# Patient Record
Sex: Female | Born: 1970 | Hispanic: No | Smoking: Never smoker
Health system: Southern US, Community
[De-identification: ages and names within clinical notes are randomized; demographics above are authoritative.]

## PROBLEM LIST (undated history)

## (undated) DIAGNOSIS — K219 Gastro-esophageal reflux disease without esophagitis: Secondary | ICD-10-CM

## (undated) DIAGNOSIS — R2231 Localized swelling, mass and lump, right upper limb: Secondary | ICD-10-CM

## (undated) DIAGNOSIS — I10 Essential (primary) hypertension: Secondary | ICD-10-CM

## (undated) HISTORY — PX: TUBAL LIGATION: SHX77

## (undated) HISTORY — PX: APPENDECTOMY: SHX54

---

## 2000-01-30 ENCOUNTER — Inpatient Hospital Stay (HOSPITAL_COMMUNITY): Admission: EM | Admit: 2000-01-30 | Discharge: 2000-02-01 | Payer: Self-pay | Admitting: Emergency Medicine

## 2000-05-30 ENCOUNTER — Encounter: Payer: Self-pay | Admitting: Emergency Medicine

## 2000-05-30 ENCOUNTER — Emergency Department (HOSPITAL_COMMUNITY): Admission: EM | Admit: 2000-05-30 | Discharge: 2000-05-30 | Payer: Self-pay | Admitting: Emergency Medicine

## 2011-03-30 HISTORY — PX: BREAST BIOPSY: SHX20

## 2012-03-07 ENCOUNTER — Encounter (HOSPITAL_COMMUNITY): Payer: Self-pay

## 2012-03-07 ENCOUNTER — Ambulatory Visit (HOSPITAL_COMMUNITY)
Admission: RE | Admit: 2012-03-07 | Discharge: 2012-03-07 | Disposition: A | Discharge status: Home / Self Care | Payer: Self-pay | Source: Ambulatory Visit | Attending: Obstetrics and Gynecology | Admitting: Obstetrics and Gynecology

## 2012-03-07 VITALS — BP 132/86 | Temp 98.3°F | Ht 63.0 in | Wt 178.4 lb

## 2012-03-07 DIAGNOSIS — N63 Unspecified lump in unspecified breast: Secondary | ICD-10-CM | POA: Insufficient documentation

## 2012-03-07 DIAGNOSIS — Z1239 Encounter for other screening for malignant neoplasm of breast: Secondary | ICD-10-CM

## 2012-03-07 HISTORY — DX: Essential (primary) hypertension: I10

## 2012-03-07 NOTE — Progress Notes (Addendum)
Patient referred to [facility] from [facility] Due to recommending left breast biopsy. Diagnostic mammogram completed 03/02/2012.  Pap Smear:    Pap smear not performed today. Per patient last Pap smear was November 2013 at ? [facility] on [address] and normal. Per patient she has no history of abnormal Pap smears. No Pap smear results in EPIC.  Physical exam: Breasts Breasts symmetrical. No skin abnormalities bilateral breasts. No nipple retraction bilateral breasts. No nipple discharge bilateral breasts. No lymphadenopathy. Palpated lump in the right breast at 8 o'clock 2 cm from the nipple. Palpated 4 small lumps in left breast at 1 o'clock 4 cm from the nipple, 2 o'clock 3 cm from the nipple, 4 o'clock 4 cm from the nipple and 8 o'clock 2 cm from the nipple. The lump at 1 o'clock is the lump that a biopsy is recommended. No complaints of pain or tenderness on exam. Patient referred to [facility] per recommendation of biopsy left breast. Appointment scheduled for Thursday, March 09, 2012 at 1300.         Pelvic/Bimanual No Pap smear completed today since last Pap smear was November 2013 and normal per patient. Pap smear not indicated per BCCCP guidelines.

## 2012-03-07 NOTE — Patient Instructions (Addendum)
Taught patient how to perform BSE and gave educational materials to take home. Patient did not need a Pap smear today due to last Pap smear was November 2013 per patient. Let her know BCCCP will cover Pap smears every 3 years unless has a history of abnormal Pap smears. Patient referred to [facility] per recommendation of biopsy left breast. Appointment scheduled for Thursday, March 09, 2012 at 1300. Patient aware of appointment and will be there. Patient verbalized understanding.

## 2012-08-18 ENCOUNTER — Telehealth (HOSPITAL_COMMUNITY): Payer: Self-pay | Admitting: *Deleted

## 2012-08-18 NOTE — Telephone Encounter (Signed)
Telephoned patient at home # with interpreter Delorise Royals. Advised patient was time to follow up with [facility] for mammogram. Patient voiced understanding. Patient would like for [place] to make appointment. Telephoned Jessica at [place] and left message to return call to [facility]

## 2013-02-02 ENCOUNTER — Encounter (HOSPITAL_COMMUNITY): Payer: Self-pay

## 2013-04-12 ENCOUNTER — Telehealth (HOSPITAL_COMMUNITY): Payer: Self-pay | Admitting: *Deleted

## 2013-04-12 NOTE — Telephone Encounter (Signed)
Telephoned patient to renew BCCCP. Patient now has insurance and will call schedule appt with [facility] for follow up.

## 2014-01-28 ENCOUNTER — Encounter (HOSPITAL_COMMUNITY): Payer: Self-pay

## 2015-02-15 ENCOUNTER — Emergency Department (HOSPITAL_COMMUNITY): Payer: Self-pay

## 2015-02-15 ENCOUNTER — Encounter (HOSPITAL_COMMUNITY): Payer: Self-pay | Admitting: Emergency Medicine

## 2015-02-15 ENCOUNTER — Emergency Department (HOSPITAL_COMMUNITY)
Admission: EM | Admit: 2015-02-15 | Discharge: 2015-02-15 | Disposition: A | Discharge status: Home / Self Care | Payer: Self-pay | Attending: Emergency Medicine | Admitting: Emergency Medicine

## 2015-02-15 DIAGNOSIS — I10 Essential (primary) hypertension: Secondary | ICD-10-CM | POA: Insufficient documentation

## 2015-02-15 DIAGNOSIS — R1310 Dysphagia, unspecified: Secondary | ICD-10-CM | POA: Insufficient documentation

## 2015-02-15 DIAGNOSIS — M542 Cervicalgia: Secondary | ICD-10-CM | POA: Insufficient documentation

## 2015-02-15 DIAGNOSIS — R0989 Other specified symptoms and signs involving the circulatory and respiratory systems: Secondary | ICD-10-CM | POA: Insufficient documentation

## 2015-02-15 LAB — BASIC METABOLIC PANEL
Anion gap: 6 (ref 5–15)
BUN: 8 mg/dL (ref 6–20)
CO2: 28 mmol/L (ref 22–32)
Calcium: 8.7 mg/dL — ABNORMAL LOW (ref 8.9–10.3)
Chloride: 104 mmol/L (ref 101–111)
Creatinine, Ser: 0.56 mg/dL (ref 0.44–1.00)
GFR calc Af Amer: >60 mL/min (ref >60)
GFR calc non Af Amer: >60 mL/min (ref >60)
Glucose, Bld: 123 mg/dL — ABNORMAL HIGH (ref 65–99)
Potassium: 3.9 mmol/L (ref 3.5–5.1)
Sodium: 138 mmol/L (ref 135–145)

## 2015-02-15 LAB — CBC WITH DIFFERENTIAL/PLATELET
Basophils Absolute: 0 10*3/uL (ref 0.0–0.1)
Basophils Relative: 1 %
Eosinophils Absolute: 0.2 10*3/uL (ref 0.0–0.7)
Eosinophils Relative: 3 %
HCT: 37.4 % (ref 36.0–46.0)
Hemoglobin: 11.9 g/dL — ABNORMAL LOW (ref 12.0–15.0)
Lymphocytes Relative: 27 %
Lymphs Abs: 2.1 10*3/uL (ref 0.7–4.0)
MCH: 28.2 pg (ref 26.0–34.0)
MCHC: 31.8 g/dL (ref 30.0–36.0)
MCV: 88.6 fL (ref 78.0–100.0)
Monocytes Absolute: 0.5 10*3/uL (ref 0.1–1.0)
Monocytes Relative: 6 %
Neutro Abs: 4.8 10*3/uL (ref 1.7–7.7)
Neutrophils Relative %: 63 %
Platelets: 372 10*3/uL (ref 150–400)
RBC: 4.22 MIL/uL (ref 3.87–5.11)
RDW: 13.5 % (ref 11.5–15.5)
WBC: 7.6 10*3/uL (ref 4.0–10.5)

## 2015-02-15 NOTE — ED Provider Notes (Signed)
CSN: [id number]     Arrival date & time 02/15/15  [id number] History   First MD Initiated Contact with Patient 02/15/15 [phone]     Chief Complaint  Patient presents with  . Foreign Body     (Consider location/radiation/quality/duration/timing/severity/associated sxs/prior Treatment) Patient is a 44 y.o. female presenting with foreign body. The history is provided by the patient and a relative.  Foreign Body Associated symptoms: trouble swallowing   Associated symptoms: no abdominal pain, no choking, no nausea, no voice change and no vomiting    patient while eating fish get sensation of a fish bone being stuck in the lower part her for throat neck area. This occurred last evening at around 7 PM. Patient's been able to eat and swallow liquids since then but this morning the sensation was more increased she's afraid that a fish bone is stuck there. No change in voice. Patient without any breathing problems.  Past Medical History  Diagnosis Date  . Hypertension    History reviewed. No pertinent past surgical history. No family history on file. Social History  Substance Use Topics  . Smoking status: Never Smoker   . Smokeless tobacco: Never Used  . Alcohol Use: No   OB History    Gravida Para Term Preterm AB TAB SAB Ectopic Multiple Living   3 3 3       3      Review of Systems  Constitutional: Negative for fever.  HENT: Positive for trouble swallowing. Negative for voice change.   Eyes: Negative for redness.  Respiratory: Negative for choking and shortness of breath.   Cardiovascular: Negative for chest pain.  Gastrointestinal: Negative for nausea, vomiting and abdominal pain.  Musculoskeletal: Positive for neck pain. Negative for neck stiffness.  Skin: Negative for rash.  Neurological: Negative for speech difficulty.  Hematological: Does not bruise/bleed easily.  Psychiatric/Behavioral: Negative for confusion.      Allergies  Review of patient's allergies indicates no known  allergies.  Home Medications   Prior to Admission medications   Not on File   BP 141/76 mmHg  Pulse 72  Temp(Src) 98 F (36.7 C) (Oral)  Resp 17  Ht 5\' 3"  (1.6 m)  Wt 197 lb 7 oz (89.557 kg)  BMI 34.98 kg/m2  SpO2 97%  LMP 01/25/2015 Physical Exam  Constitutional: She is oriented to person, place, and time. She appears well-developed and well-nourished. No distress.  HENT:  Head: Normocephalic and atraumatic.  Mouth/Throat: Oropharynx is clear and moist. No oropharyngeal exudate.  Eyes: Conjunctivae and EOM are normal. Pupils are equal, round, and reactive to light.  Neck: Normal range of motion. Neck supple.  Cardiovascular: Normal rate and regular rhythm.   No murmur heard. Pulmonary/Chest: Effort normal and breath sounds normal.  Abdominal: Soft. Bowel sounds are normal. There is no tenderness.  Musculoskeletal: Normal range of motion.  Neurological: She is alert and oriented to person, place, and time. No cranial nerve deficit. She exhibits normal muscle tone. Coordination normal.  Skin: Skin is warm. No rash noted.  Nursing note and vitals reviewed.   ED Course  Procedures (including critical care time) Labs Review Labs Reviewed  BASIC METABOLIC PANEL - Abnormal; Notable for the following:    Glucose, Bld 123 (*)    Calcium 8.7 (*)    All other components within normal limits  CBC WITH DIFFERENTIAL/PLATELET - Abnormal; Notable for the following:    Hemoglobin 11.9 (*)    All other components within normal limits   Results for  orders placed or performed during the hospital encounter of 0000000  Basic metabolic panel  Result Value Ref Range   Sodium 138 135 - 145 mmol/L   Potassium 3.9 3.5 - 5.1 mmol/L   Chloride 104 101 - 111 mmol/L   CO2 28 22 - 32 mmol/L   Glucose, Bld 123 (H) 65 - 99 mg/dL   BUN 8 6 - 20 mg/dL   Creatinine, Ser 0.56 0.44 - 1.00 mg/dL   Calcium 8.7 (L) 8.9 - 10.3 mg/dL   GFR calc non Af Amer >60 >60 mL/min   GFR calc Af Amer >60 >60  mL/min   Anion gap 6 5 - 15  CBC with Differential/Platelet  Result Value Ref Range   WBC 7.6 4.0 - 10.5 K/uL   RBC 4.22 3.87 - 5.11 MIL/uL   Hemoglobin 11.9 (L) 12.0 - 15.0 g/dL   HCT 37.4 36.0 - 46.0 %   MCV 88.6 78.0 - 100.0 fL   MCH 28.2 26.0 - 34.0 pg   MCHC 31.8 30.0 - 36.0 g/dL   RDW 13.5 11.5 - 15.5 %   Platelets 372 150 - 400 K/uL   Neutrophils Relative % 63 %   Neutro Abs 4.8 1.7 - 7.7 K/uL   Lymphocytes Relative 27 %   Lymphs Abs 2.1 0.7 - 4.0 K/uL   Monocytes Relative 6 %   Monocytes Absolute 0.5 0.1 - 1.0 K/uL   Eosinophils Relative 3 %   Eosinophils Absolute 0.2 0.0 - 0.7 K/uL   Basophils Relative 1 %   Basophils Absolute 0.0 0.0 - 0.1 K/uL     Imaging Review Ct Soft Tissue Neck Wo Contrast  02/15/2015  CLINICAL DATA:  The patient feels like she has a fish bone stuck in her throat from dinner last night. EXAM: CT NECK WITHOUT CONTRAST TECHNIQUE: Multidetector CT imaging of the neck was performed following the standard protocol without intravenous contrast. COMPARISON:  None. FINDINGS: There is no evidence of radiodense foreign body in the nasopharynx or oropharynx. There are calcifications in the crypts of the tonsils. No prevertebral soft tissue swelling. No mass lesions. Thyroid gland is normal. Lung apices are normal. IMPRESSION: No visible foreign body.  Normal exam. Electronically Signed   By: Lorriane Shire M.D.   On: 02/15/2015 08:53   I have personally reviewed and evaluated these images and lab results as part of my medical decision-making.   EKG Interpretation None      MDM   Final diagnoses:  Foreign body sensation in throat    CT scan of the neck soft tissue without evidence of any foreign body. Suspect this may be a sensation. Patient able to swallow okay. Will recommend observation patient will return if symptoms do not improve over the next couple days or for any new or worse symptoms. Referral to ear nose and throat provided as needed.  Labs  without any significant abnormalities.    Fredia Sorrow, MD 02/15/15 1008

## 2015-02-15 NOTE — Discharge Instructions (Signed)
CT scan done showed no evidence of a foreign body in the neck. Would recommending seeing of the sensation improves over the next couple days. If not make an appointment to follow up with ear nose and throat or return here for reevaluation. Return here for any new or worse symptoms.

## 2015-02-15 NOTE — ED Notes (Signed)
Daughter/translator stated, she got a fish bone in her throat.

## 2016-06-22 ENCOUNTER — Ambulatory Visit: Payer: Self-pay | Admitting: Nurse Practitioner

## 2016-07-06 ENCOUNTER — Ambulatory Visit (INDEPENDENT_AMBULATORY_CARE_PROVIDER_SITE_OTHER): Payer: BLUE CROSS/BLUE SHIELD | Admitting: Nurse Practitioner

## 2016-07-06 ENCOUNTER — Other Ambulatory Visit (INDEPENDENT_AMBULATORY_CARE_PROVIDER_SITE_OTHER): Payer: BLUE CROSS/BLUE SHIELD

## 2016-07-06 ENCOUNTER — Encounter: Payer: Self-pay | Admitting: Nurse Practitioner

## 2016-07-06 VITALS — BP 132/90 | HR 70 | Temp 97.6°F | Ht 62.0 in | Wt 209.0 lb

## 2016-07-06 DIAGNOSIS — E669 Obesity, unspecified: Secondary | ICD-10-CM | POA: Diagnosis not present

## 2016-07-06 DIAGNOSIS — D229 Melanocytic nevi, unspecified: Secondary | ICD-10-CM | POA: Insufficient documentation

## 2016-07-06 DIAGNOSIS — Z136 Encounter for screening for cardiovascular disorders: Secondary | ICD-10-CM | POA: Diagnosis not present

## 2016-07-06 DIAGNOSIS — Z13220 Encounter for screening for lipoid disorders: Secondary | ICD-10-CM | POA: Diagnosis not present

## 2016-07-06 DIAGNOSIS — E782 Mixed hyperlipidemia: Secondary | ICD-10-CM | POA: Diagnosis not present

## 2016-07-06 DIAGNOSIS — Z23 Encounter for immunization: Secondary | ICD-10-CM

## 2016-07-06 DIAGNOSIS — Z0000 Encounter for general adult medical examination without abnormal findings: Secondary | ICD-10-CM

## 2016-07-06 DIAGNOSIS — R739 Hyperglycemia, unspecified: Secondary | ICD-10-CM | POA: Diagnosis not present

## 2016-07-06 DIAGNOSIS — M67449 Ganglion, unspecified hand: Secondary | ICD-10-CM | POA: Insufficient documentation

## 2016-07-06 DIAGNOSIS — R2231 Localized swelling, mass and lump, right upper limb: Secondary | ICD-10-CM | POA: Diagnosis not present

## 2016-07-06 LAB — COMPREHENSIVE METABOLIC PANEL
ALBUMIN: 3.9 g/dL (ref 3.5–5.2)
ALK PHOS: 97 U/L (ref 39–117)
ALT: 38 U/L — ABNORMAL HIGH (ref 0–35)
AST: 25 U/L (ref 0–37)
BILIRUBIN TOTAL: 0.4 mg/dL (ref 0.2–1.2)
BUN: 11 mg/dL (ref 6–23)
CALCIUM: 8.9 mg/dL (ref 8.4–10.5)
CO2: 28 mEq/L (ref 19–32)
Chloride: 102 mEq/L (ref 96–112)
Creatinine, Ser: 0.49 mg/dL (ref 0.40–1.20)
GFR: 143.11 mL/min (ref >60.00)
GLUCOSE: 128 mg/dL — AB (ref 70–99)
POTASSIUM: 4.1 meq/L (ref 3.5–5.1)
Sodium: 135 mEq/L (ref 135–145)
TOTAL PROTEIN: 7.3 g/dL (ref 6.0–8.3)

## 2016-07-06 LAB — LIPID PANEL
Cholesterol: 209 mg/dL — ABNORMAL HIGH (ref 0–200)
HDL: 60.6 mg/dL (ref >39.00)
LDL Cholesterol: 126 mg/dL — ABNORMAL HIGH (ref 0–99)
NonHDL: 147.95
TRIGLYCERIDES: 110 mg/dL (ref 0.0–149.0)
Total CHOL/HDL Ratio: 3
VLDL: 22 mg/dL (ref 0.0–40.0)

## 2016-07-06 LAB — CBC
HCT: 36.9 % (ref 36.0–46.0)
Hemoglobin: 12.5 g/dL (ref 12.0–15.0)
MCHC: 33.9 g/dL (ref 30.0–36.0)
MCV: 83 fl (ref 78.0–100.0)
PLATELETS: 413 10*3/uL — AB (ref 150.0–400.0)
RBC: 4.45 Mil/uL (ref 3.87–5.11)
RDW: 14.1 % (ref 11.5–15.5)
WBC: 8.2 10*3/uL (ref 4.0–10.5)

## 2016-07-06 LAB — TSH: TSH: 1.38 u[IU]/mL (ref 0.35–4.50)

## 2016-07-06 NOTE — Progress Notes (Signed)
Pre visit review using our clinic review tool, if applicable. No additional management support is needed unless otherwise documented below in the visit note. 

## 2016-07-06 NOTE — Patient Instructions (Signed)
Hacer ejercicio para Pharmacist, hospital (Exercising to [employer]) Hacer ejercicio puede ayudarlo a bajar de peso. Para bajar de [employer] ejercicio, este debe ser de intensidad vigorosa. Puede saber que est haciendo ejercicio de intensidad vigorosa si respira con mucha dificultad y rapidez, y no puede mantener una conversacin. El ejercicio de intensidad moderada ayuda a Contractor peso actual. Puede saber que est haciendo ejercicio de intensidad moderada si tiene una frecuencia cardaca ms elevada y [place] respiracin ms rpida, pero an puede Programmer, systems. Toast? Elija una actividad que disfrute y establezca objetivos realistas. El mdico puede ayudarlo a Paediatric nurse un plan de actividades que funcione para usted. Haga ejercicio regularmente como se lo haya indicado el mdico. Esta puede incluir:  Neurosurgeon de resistencia dos veces por semana, como:  Flexiones de [place].  Abdominales.  Levantamiento de pesas.  Ejercicios con bandas elsticas.  Realizar una intensidad determinada de ejercicio durante una cantidad determinada de [place]. Elija entre estas opciones:  172minutos de ejercicio de intensidad moderada cada semana.  50minutos de ejercicio de intensidad vigorosa cada semana.  [employer] de ejercicio de intensidad moderada y vigorosa cada semana. Los nios, las mujeres [place], las personas que no estn en forma, las personas con sobrepeso y los adultos mayores tal vez tengan que consultar a un mdico para que les d Glass blower/designer. Si tiene [employer], asegrese de Teacher, adult education al mdico antes de comenzar un programa de ejercicios nuevo. [place]?  Caminar a un ritmo de al menos 4,60millas (7kilmetros) por hora.  Trotar o correr a un ritmo de 5 millas (8 kilmetros) por hora.  Andar en bicicleta a un ritmo de al menos 10 millas (16  kilmetros) por hora.  Practicar natacin.  Practicar patinaje sobre ruedas normales o en lnea.  Hacer esqu de fondo.  Hacer deportes competitivos vigorosos, como ftbol americano, bsquet y ftbol.  Saltar la soga.  Tomar clases de baile aerbico. CMO PUEDO SER MS ACTIVO EN MIS ACTIVIDADES DIARIAS?  Utilice las Clinical cytogeneticist del ascensor.  D una caminata durante su hora de almuerzo.  Si conduce, estacione el automvil ms lejos del trabajo o de la escuela.  Si [place] transporte pblico, bjese una parada antes y camine el resto del camino.  Pngase de pie y camine cada vez que haga llamadas telefnicas.  Levntese, estrese y camine cada 70minutos a lo largo del Training and development officer. [place]?  No haga ejercicio en exceso que pudiera hacer que se lastime, se sienta mareado o tenga dificultad para respirar.  Consulte al mdico antes de comenzar un programa de ejercicios nuevo.  Use ropa cmoda y calzado con buen soporte.  Beba gran cantidad de agua mientras hace ejercicios para evitar la deshidratacin o los golpes de Freight forwarder. Durante la actividad fsica se pierde agua corporal que se debe reponer.  Haga ejercicio hasta que se acelere su respiracin y sus latidos cardacos. Esta informacin no tiene Marine scientist el consejo del mdico. Asegrese de hacerle al mdico cualquier pregunta que tenga. Document Released: 06/19/2010 Document Revised: 04/05/2014 Document Reviewed: 08/16/2013 Elsevier Interactive Patient Education  2017 [place] de caloras para bajar de peso (Calorie Counting for [employer] Life Loss) Las caloras son energa que se obtiene de lo que se come y se bebe. El organismo [place] esta energa para mantenerlo Curator. La cantidad de caloras que  come tiene incidencia en [employer]. Cuando come ms caloras de las que el cuerpo necesita, este acumula las caloras extra Bastian. Cuando come Universal Health  de las que el cuerpo [place], este quema grasa para obtener la energa que requiere. El recuento de caloras es el registro de la cantidad de caloras que come y Pharmacologist. Si se asegura de comer menos caloras de las que el cuerpo necesita, debe bajar de [place]. Para que el recuento de caloras funcione, tendr que comer la cantidad de caloras adecuadas para usted en un da, para bajar una cantidad de peso saludable por semana. Una cantidad de peso saludable para bajar por semana suele ser [place] 1 y Ivar Drape (0,5 a 0,9kg). Un nutricionista puede determinar la cantidad de caloras que necesita por da y sugerirle cmo alcanzar su objetivo calrico. CUL ES MI PLAN? Mi objetivo es comer __________ Raenette Rover da. Si como esta cantidad de caloras por da, debo bajar unas __________ Terrall Laity. QU DEBO SABER ACERCA DEL [place]? A fin de alcanzar su objetivo diario de caloras, tendr que:  Averiguar cuntas caloras hay en cada alimento que le Therapist, occupational. Intente hacerlo antes de comer.  Decida la cantidad que puede comer del alimento.  Anote lo que comi y cuntas caloras tena. Esta tarea se conoce como llevar un registro de comidas. DNDE ENCUENTRO INFORMACIN SOBRE LAS CALORAS? Es posible Animator cantidad de caloras que contiene un alimento en la etiqueta de informacin nutricional. Tenga en cuenta que toda la informacin que se incluye en una etiqueta se basa en una porcin especfica del alimento. Si un alimento no tiene una etiqueta de informacin nutricional, intente buscar las caloras en Internet o pida ayuda al nutricionista. CMO DECIDO CUNTO COMER? Para decidir qu cantidad del alimento puede comer, tendr que tener en cuenta el nmero de caloras en una porcin y el tamao de una porcin. Es posible Financial trader en la etiqueta de informacin nutricional. Si un alimento no tiene una etiqueta de informacin nutricional, busque los [place]  en Internet o pida ayuda al nutricionista. Recuerde que las caloras se calculan por porcin. Si opta por comer ms de una porcin de un alimento, tendr [employer]ne[employer] Inc las caloras por porcin por la cantidad de porciones que planea comer. Por ejemplo, la etiqueta de un envase de pan puede decir que el tamao de una porcin es [place], y que una porcin tiene 90caloras. Si come 1rodaja, habr comido 90caloras. Si come 2rodajas, habr comido 180caloras. [place]? Despus de cada comida, registre la siguiente informacin en el registro de comidas:  Lo que comi.  La cantidad que comi.  La cantidad de caloras que tena.  Luego, sume las caloras. Tenga a Materials engineer de comidas, por ejemplo, en un anotador de bolsillo. Otra alternativa es usar una aplicacin en el telfono mvil o un sitio web. Algunos programas calcularn las caloras y [employer] la cantidad de caloras que le [place], [place] vez que agregue un alimento al Control and instrumentation engineer. CULES SON ALGUNOS CONSEJOS PARA EL [place]?  Use las caloras de los alimentos y las bebidas que lo sacien y no lo dejen con apetito, por ejemplo, frutos secos y Engineer, mining de frutos secos, verduras, protenas magras y alimentos con alto contenido de fibra (ms de 5g de fibra por porcin).  Coma alimentos nutritivos y evite las caloras vacas. Las caloras vacas son aquellas que se obtienen de los alimentos o las  bebidas que no contienen muchos nutrientes, [employer] y los refrescos. Es mejor comer una comida nutritiva altamente calrica (como un aguacate) que una con pocos nutrientes (como una bolsa de patatas fritas).  Sepa cuntas caloras tienen los alimentos que come con ms frecuencia. eBay, no tiene que buscar este dato cada vez que los come.  Est atento a los Chiropodist hipocalricos, pero que en realidad contienen muchas caloras, como los productos de [place], los  refrescos y los dulces sin Lobbyist.  Preste atencin a las [employer] Data, Franklin Resources refrescos, las bebidas a base de [place], las bebidas con alcohol y los jugos, que contienen muchas caloras, pero no le dan saciedad. Opte por las bebidas bajas en caloras, como el agua y las bebidas dietticas.  Concntrese en tratar de contar las caloras de los alimentos que tienen la mayor cantidad de caloras. Registrar las caloras de una ensalada que solo contiene hortalizas es menos importante que calcular las de un batido de [place].  Encuentre un mtodo para controlar las caloras que funcione para usted. Sea creativo. La State Farm de las personas que alcanzan el xito encuentran mtodos para llevar un registro de cunto comen en un da, incluso si no cuentan cada calora. CULES SON ALGUNOS CONSEJOS PARA CONTROLAR LAS PORCIONES?  Sepa cuntas caloras hay en una porcin. Esto lo ayudar a saber cuntas porciones de un alimento determinado puede comer.  Use una taza medidora para medir los Northrop Grumman, lo que es muy til al principio. Con el tiempo, podr hacer un clculo estimativo de los tamaos de las porciones de algunos alimentos.  Dedique tiempo a poner porciones de diferentes alimentos en sus platos, tazones y tazas predilectos, a fin de saber cmo se ve una porcin.  Intente no comer directamente de [place] bolsa o una caja, ya que esto puede llevarlo a comer en exceso. Ponga la cantidad [employer]akes gustara comer en una taza o un plato, a fin de asegurarse de que est comiendo la porcin correcta.  Use platos, vasos y tazones ms pequeos para no comer en exceso. Esta es una forma rpida y sencilla de poner en prctica el control de las porciones. Si el plato es ms pequeo, le caben menos alimentos.  Intente no hacer muchas tareas mientras come, como ver televisin o usar la computadora. Si es la hora de comer, sintese a Conservation officer, nature y disfrute de Environmental education officer. Esto lo ayudar a que empiece a  Marine scientist cundo est satisfecho. Tambin le permitir estar ms consciente de lo que come y cunto est comiendo. [place]?  Pida porciones ms pequeas o porciones para nios.  Considere la posibilidad de Publishing rights manager un plato principal y las guarniciones, en lugar de pedir su propio plato principal.  Si pide su propio plato principal, coma solo la mitad. Pida una caja al comienzo de la comida y ponga all el resto del plato principal, para no sentir la tentacin de comerlo.  Busque las caloras en el men. Si se detallan las caloras, elija las opciones que contengan la menor cantidad.  Elija platos que incluyan verduras, frutas, cereales integrales, productos lcteos con bajo contenido de grasa y Advertising account planner. Centrarse en elegir con inteligencia alimentos de cada uno de los 5grupos que puede ayudarlo a seguir por el buen camino en los restaurantes.  Opte por los alimentos hervidos, asados, cocidos a la parrilla o al vapor.  Elija el  agua, la [place], el t helado sin azcar u otras bebidas que no contengan azcares agregados. Si desea una bebida alcohlica, escoja una opcin con menos caloras. Por ejemplo, un margarita normal puede [employer] Sherwin[employer]Williams, y un vaso de vino tiene unas 150.  No coma alimentos que contengan mantequilla, estn empanados, fritos o que se sirvan con salsa a base de crema. Generalmente, los alimentos que se etiquetan como "crujientes" estn fritos, a menos que se indique lo contrario.  Ordene los Kimberly-Clark, las salsas y los jarabes aparte, ya que suelen tener muchas caloras; por lo tanto, no los consuma en grandes cantidades.  Tenga cuidado con las [place]. Muchas personas piensan que las ensaladas son Ardelia Mems opcin saludable, pero en muchas cosas, esto no es as. Hay muchas ensaladas que contienen tocino, pollo frito, grandes cantidades de [place], patatas fritas y Lexicographer. Todos estos productos son altamente  calricos. Si desea Katherine Mantle, elija una de hortalizas y pida carnes a la parrilla o un filete. Ordene el aderezo aparte o pida aceite de [place] y vinagre o limn para Haematologist.  Haga un clculo estimativo de la cantidad de porciones que le sirven. Por ejemplo, una porcin de arroz cocido equivale a media taza o tiene el tamao aproximado de un molde de [place], o de media pelota de tenis. Conocer el tamao de las porciones lo ayudar a Personnel officer atento a la cantidad de comida que come Occidental Petroleum. La lista que sigue le [place] el tamao de algunas porciones comunes a partir de objetos cotidianos.  1onza (28g) = 4dados apilados.  3onzas (85g) = 60mazo de cartas.  1cucharadita = 1dado.  1cucharada = media pelota de tenis de mesa.  2cucharadas = 1pelota de tenis de mesa.  Media taza = 1pelota de tenis o 44molde de magdalena.  1taza = 1 pelota de bisbol. Esta informacin no tiene Marine scientist el consejo del mdico. Asegrese de hacerle al mdico cualquier pregunta que tenga. Document Released: 07/01/2008 Document Revised: 04/05/2014 Document Reviewed: 01/18/2013 Elsevier Interactive Patient Education  2017 [employer].

## 2016-07-06 NOTE — Progress Notes (Signed)
Subjective:    Patient ID: Sarah Rubio, female    DOB: 1970/11/09, 46 y.o.   MRN: [id number]  Patient presents today for establish care (new patient)  Rash  This is a chronic problem. The current episode started more than 1 year ago. The problem has been gradually worsening since onset. The affected locations include the face and right hand. She was exposed to nothing. Associated symptoms include joint pain. Pertinent negatives include no congestion, cough, diarrhea, fever, shortness of breath or sore throat. Past treatments include nothing.   No previous GYN.  Immunizations: (TDAP, Hep C screen, Pneumovax, Influenza, zoster)  Health Maintenance  Topic Date Due  . HIV Screening  04/05/1983  . Pap Smear  04/04/1989  . Flu Shot  10/27/2016  . Tetanus Vaccine  07/07/2026   Diet:regular Weight:  Wt Readings from Last 3 Encounters:  07/06/16 209 lb (94.8 kg)  02/15/15 197 lb 7 oz (89.6 kg)  03/07/12 178 lb 6.4 oz (80.9 kg)   Exercise:none Fall Risk: Fall Risk  07/06/2016  Falls in the past year? No   Home Safety: home with children and  Significant other Depression/Suicide: Depression screen [facility] 2/9 07/06/2016  Decreased Interest 0  Down, Depressed, Hopeless 0  PHQ - 2 Score 0   No flowsheet data found. Pap Smear (every 33yrs for >21-29 without HPV, every 34yrs for >30-70yrs with HPV):needed Mammogram (yearly, >79yrs):needed Vision:needed Dental:needed Advanced Directive: Advanced Directives 02/15/2015  Does Patient Have a Medical Advance Directive? No   Sexual History (birth control, marital status, STD):single, living with significant other, sexually active, no contraceptive use.  Medications and allergies reviewed with patient and updated if appropriate.  Patient Active Problem List   Diagnosis Date Noted  . Hyperglycemia 07/08/2016  . Obesity (BMI 35.0-39.9 without comorbidity) 07/08/2016  . Mixed hyperlipidemia 07/08/2016  . Atypical mole 07/06/2016  . Lump  on finger, right 07/06/2016  . Breast lump 03/07/2012    No current outpatient prescriptions on file prior to visit.   No current facility-administered medications on file prior to visit.     Past Medical History:  Diagnosis Date  . Hypertension     Past Surgical History:  Procedure Laterality Date  . APPENDECTOMY      Social History   Social History  . Marital status: Widowed    Spouse name: N/A  . Number of children: N/A  . Years of education: N/A   Social History Main Topics  . Smoking status: Never Smoker  . Smokeless tobacco: Never Used  . Alcohol use No  . Drug use: No  . Sexual activity: Yes    Birth control/ protection: None   Other Topics Concern  . None   Social History Narrative  . None    Family History  Problem Relation Age of Onset  . Diabetes Mother         Review of Systems  Constitutional: Negative for fever, malaise/fatigue and weight loss.  HENT: Negative for congestion and sore throat.   Eyes:       Negative for visual changes  Respiratory: Negative for cough and shortness of breath.   Cardiovascular: Negative for chest pain, palpitations and leg swelling.  Gastrointestinal: Negative for blood in stool, constipation, diarrhea and heartburn.  Genitourinary: Negative for dysuria, frequency and urgency.  Musculoskeletal: Positive for joint pain. Negative for falls and myalgias.  Skin: Positive for rash.  Neurological: Negative for dizziness, sensory change and headaches.  Endo/Heme/Allergies: Does not bruise/bleed easily.  Psychiatric/Behavioral: Negative  for depression, substance abuse and suicidal ideas. The patient is not nervous/anxious.     Objective:   Vitals:   07/06/16 1116  BP: 132/90  Pulse: 70  Temp: 97.6 F (36.4 C)    Body mass index is 38.23 kg/m.   Physical Examination:  Physical Exam  Constitutional: She is oriented to person, place, and time.  Musculoskeletal: She exhibits tenderness. She exhibits no  edema.       Right hand: She exhibits tenderness. She exhibits normal range of motion and no bony tenderness. Normal sensation noted. Normal strength noted.       Hands: Neurological: She is alert and oriented to person, place, and time. Gait normal.  Skin: Skin is warm and dry. Lesion and rash noted. Rash is papular and nodular. No erythema.       ASSESSMENT and PLAN:  Lorelee was seen today for establish care.  Diagnoses and all orders for this visit:  Encounter for medical examination to establish care -     Lipid panel; Future -     TSH; Future -     Comprehensive metabolic panel; Future -     CBC; Future  Atypical mole -     Cancel: Ambulatory referral to Dermatology -     Ambulatory referral to Dermatology  Lump on finger, right -     Cancel: Ambulatory referral to Dermatology -     Ambulatory referral to Dermatology  Mixed hyperlipidemia -     Lipid panel; Future  Obesity (BMI 35.0-39.9 without comorbidity) -     TSH; Future -     Comprehensive metabolic panel; Future -     CBC; Future  Need for diphtheria-tetanus-pertussis (Tdap) vaccine, adult/adolescent -     Tdap vaccine greater than or equal to 7yo IM  Hyperglycemia   No problem-specific Assessment & Plan notes found for this encounter.     Follow up: Return in about 3 months (around 10/05/2016) for CPE (fasting, need PAP and breast exam).  Wilfred Lacy, NP

## 2016-07-08 DIAGNOSIS — E782 Mixed hyperlipidemia: Secondary | ICD-10-CM | POA: Insufficient documentation

## 2016-07-08 DIAGNOSIS — E669 Obesity, unspecified: Secondary | ICD-10-CM | POA: Insufficient documentation

## 2016-07-08 DIAGNOSIS — R739 Hyperglycemia, unspecified: Secondary | ICD-10-CM | POA: Insufficient documentation

## 2016-07-08 DIAGNOSIS — Z6833 Body mass index (BMI) 33.0-33.9, adult: Secondary | ICD-10-CM | POA: Insufficient documentation

## 2016-07-14 ENCOUNTER — Telehealth: Payer: Self-pay | Admitting: Nurse Practitioner

## 2016-07-14 NOTE — Telephone Encounter (Signed)
Noted  

## 2016-07-14 NOTE — Telephone Encounter (Signed)
Patient does not want to reschedule CPE at this time.  She will call back when ready to schedule.

## 2016-07-16 ENCOUNTER — Encounter: Payer: Self-pay | Admitting: Nurse Practitioner

## 2016-07-27 ENCOUNTER — Encounter: Payer: BLUE CROSS/BLUE SHIELD | Admitting: Nurse Practitioner

## 2018-07-26 ENCOUNTER — Telehealth: Payer: Self-pay | Admitting: Nurse Practitioner

## 2018-07-26 NOTE — Telephone Encounter (Signed)
I called and left message on patient voicemail per Sarah Rubio to call office and schedule appointment for CPE.

## 2020-08-18 ENCOUNTER — Ambulatory Visit (INDEPENDENT_AMBULATORY_CARE_PROVIDER_SITE_OTHER): Payer: 59 | Admitting: Family Medicine

## 2020-08-18 ENCOUNTER — Encounter: Payer: Self-pay | Admitting: Family Medicine

## 2020-08-18 ENCOUNTER — Other Ambulatory Visit: Payer: Self-pay

## 2020-08-18 VITALS — BP 128/76 | HR 97 | Temp 98.7°F | Ht 62.58 in | Wt 193.2 lb

## 2020-08-18 DIAGNOSIS — M67449 Ganglion, unspecified hand: Secondary | ICD-10-CM

## 2020-08-18 DIAGNOSIS — B351 Tinea unguium: Secondary | ICD-10-CM

## 2020-08-18 DIAGNOSIS — K219 Gastro-esophageal reflux disease without esophagitis: Secondary | ICD-10-CM

## 2020-08-18 DIAGNOSIS — E782 Mixed hyperlipidemia: Secondary | ICD-10-CM | POA: Diagnosis not present

## 2020-08-18 DIAGNOSIS — R739 Hyperglycemia, unspecified: Secondary | ICD-10-CM | POA: Diagnosis not present

## 2020-08-18 LAB — LIPID PANEL
Cholesterol: 211 mg/dL — ABNORMAL HIGH (ref 0–200)
HDL: 65.3 mg/dL (ref >39.00)
LDL Cholesterol: 123 mg/dL — ABNORMAL HIGH (ref 0–99)
NonHDL: 145.6
Total CHOL/HDL Ratio: 3
Triglycerides: 115 mg/dL (ref 0.0–149.0)
VLDL: 23 mg/dL (ref 0.0–40.0)

## 2020-08-18 LAB — HEMOGLOBIN A1C: Hgb A1c MFr Bld: 6.6 % — ABNORMAL HIGH (ref 4.6–6.5)

## 2020-08-18 MED ORDER — OMEPRAZOLE 20 MG PO CPDR
20.0000 mg | DELAYED_RELEASE_CAPSULE | Freq: Every day | ORAL | 3 refills | Status: DC
Start: 2020-08-18 — End: 2020-10-20

## 2020-08-18 NOTE — Progress Notes (Signed)
[facility] [address] [place] [address] Dept: [phone] Dept Fax: [phone]  New Patient Office Visit  Subjective:    Patient ID: Sarah Rubio, female    DOB: [birth year], 50 y.o..   MRN: [id number]  Chief Complaint  Patient presents with  . Establish Care    NP- establish care.  C/o having acid reflux on daily basis. Wants referral for Rt little finger.    History of Present Illness:  Patient is in today to establish care. Interpretation services were provided by Sarah Rubio (with [facility]). Her daughter Sarah Rubio also present. Sarah Rubio is originally from [place]. She has lived in the [place] for about 25 years. She is not married. She has three children (37, 30, 37). She works in a Therapist, nutritional. She denies any alcohol, tobacco, or drug use.  Sarah Rubio has a history of a lump on her right 5th finger for 6-7 years. This is not tender. She does want to be seen to consider removal of the lump.  Sarah Rubio notes an issue with recurrent acid reflux and heartburn for about 3 years. She notes this is made worse by eating later than about 6:00 pm, eating spicy foods, or acid foods (lemons). She finds her symptoms improve if she avoids these trigger foods. She has not taken any medication for this.  Sarah Rubio has noted discoloration of her right great toe nail. She believes this is from fungus. She has tried OTC nail preparations without any improvement.  Sarah Rubio has a history of hyperglycemia. Her mother has diabetes. She also has a history of elevated lipids. She would like these to be tested.  Past Medical History: Patient Active Problem List   Diagnosis Date Noted  . Hyperglycemia 07/08/2016  . Obesity (BMI 30.0-34.9) 07/08/2016  . Mixed hyperlipidemia 07/08/2016  . Atypical mole 07/06/2016  . Ganglion cyst of finger, right 5th 07/06/2016  . Breast lump 03/07/2012   Past  Surgical History:  Procedure Laterality Date  . APPENDECTOMY     Family History  Problem Relation Age of Onset  . Diabetes Mother    No outpatient medications prior to visit.   No facility-administered medications prior to visit.   No Known Allergies    Objective:   Today's Vitals   08/18/20 1108  BP: 128/76  Pulse: 97  Temp: 98.7 F (37.1 C)  TempSrc: Temporal  SpO2: 98%  Weight: 193 lb 3.2 oz (87.6 kg)  Height: 5' 2.58" (1.59 m)   Body mass index is 34.68 kg/m.   General: Well developed, well nourished. No acute distress. Right hand: There is a 1 cm oblong, fluctuance  cyst-like mass over the dorsum fo the right 5th finger between the PIP and   MCP joint. FROM of the finger. Right foot: The right great toenail has yellowish discoloration, ridging, and mild thickening. Psych: Alert and oriented x3. Normal mood and affect.  Health Maintenance Due  Topic Date Due  . COVID-19 Vaccine (1) Never done  . HIV Screening  Never done  . Hepatitis C Screening  Never done  . PAP SMEAR-Modifier  Never done  . COLONOSCOPY (Pts 45-81yrs Insurance coverage will need to be confirmed)  Never done  . MAMMOGRAM  Never done   Assessment & Plan:   1. Mixed hyperlipidemia Borderline elevations noted in 2018. We will reassess at this point.  - Lipid panel  2. Hyperglycemia Prior elevated blood sugars with a family  history of diabetes. I will check an HbA1c.  - Hemoglobin A1c  3. Ganglion cyst of finger, right 5th This appears to be a ganglion cyst. Recommend referral to ortho/hand surgeon for potential cyst removal.  - Ambulatory referral to Orthopedic Surgery  4. Onychomycosis Refer to podiatry for management of onychomycosis.  - Ambulatory referral to Podiatry  5. Gastroesophageal reflux disease without esophagitis Symptoms consistent with GERD. I recommend we start her on a PPI and reassess this in 3 months. At that visit, we will plan a physical exam and catch up her  routine preventative care.  - omeprazole (PRILOSEC) 20 MG capsule; Take 1 capsule (20 mg total) by mouth daily.  Dispense: 30 capsule; Refill: 3  Haydee Salter, MD

## 2020-09-01 ENCOUNTER — Ambulatory Visit (INDEPENDENT_AMBULATORY_CARE_PROVIDER_SITE_OTHER): Payer: 59 | Admitting: Family Medicine

## 2020-09-01 ENCOUNTER — Ambulatory Visit: Payer: Self-pay

## 2020-09-01 ENCOUNTER — Other Ambulatory Visit: Payer: Self-pay

## 2020-09-01 DIAGNOSIS — R2231 Localized swelling, mass and lump, right upper limb: Secondary | ICD-10-CM | POA: Diagnosis not present

## 2020-09-01 NOTE — Progress Notes (Signed)
   Office Visit Note   Patient: Sarah Rubio           Date of Birth: Dec 12, 1970           MRN: [id number] Visit Date: 09/01/2020 Requested by: Haydee Salter, MD [place],  [place] [address] PCP: Haydee Salter, MD  Subjective: Chief Complaint  Patient presents with  . Right Little Finger - Cyst    Cyst on little finger x 8 years. NKI, "just started growing." Not painful. Does not interfere with ROM. Right-hand dominant.    HPI: She is here with a mass of her right fifth finger.  Interpreter is present today.  The mass has been on the dorsum of her fifth finger proximal phalanx area for about 8 years.  There was no injury, it does not hurt and it has stayed about the same size.  It does not appear with her activities.  Denies any numbness or tingling.  No masses anywhere else on her body.  She would like to have it removed.              ROS:   All other systems were reviewed and are negative.  Objective: Vital Signs: There were no vitals taken for this visit.  Physical Exam:  General:  Alert and oriented, in no acute distress. Pulm:  Breathing unlabored. Psy:  Normal mood, congruent affect. Skin: No erythema Right fifth finger: She has full range of motion of all of the joints of her finger pain-free.  There is a soft tissue mass on the dorsum of the proximal phalanx which is freely mobile and nontender.  It is roughly 1 cm in length.  Imaging: US Guided Needle Placement  Result Date: 09/01/2020 Limited diagnostic ultrasound of the right fifth finger, dorsal aspect of proximal phalanx reveals a solid mass superficial to the extensor tendon with no hyperemia on power Doppler imaging.   Assessment & Plan: 1.  Right fifth finger mass, suspect lipoma but unable to adequately characterize it with ultrasound today. -Discussed options with her, including MRI scan to further evaluate followed by surgical consult versus first obtaining surgical consult.  She  would like to meet with Dr. Erlinda Hong to discuss the possibility of excision.  I will let him decide whether additional imaging is warranted prior to surgery.     Procedures: No procedures performed        PMFS History: Patient Active Problem List   Diagnosis Date Noted  . Gastroesophageal reflux disease without esophagitis 08/18/2020  . Hyperglycemia 07/08/2016  . Obesity (BMI 30.0-34.9) 07/08/2016  . Mixed hyperlipidemia 07/08/2016  . Atypical mole 07/06/2016  . Ganglion cyst of finger, right 5th 07/06/2016  . Breast lump 03/07/2012   Past Medical History:  Diagnosis Date  . Hypertension     Family History  Problem Relation Age of Onset  . Diabetes Mother     Past Surgical History:  Procedure Laterality Date  . APPENDECTOMY     Social History   Occupational History  . Not on file  Tobacco Use  . Smoking status: Never Smoker  . Smokeless tobacco: Never Used  Substance and Sexual Activity  . Alcohol use: No  . Drug use: No  . Sexual activity: Yes    Birth control/protection: None

## 2020-09-22 ENCOUNTER — Ambulatory Visit: Payer: 59 | Admitting: Family Medicine

## 2020-09-24 ENCOUNTER — Ambulatory Visit (INDEPENDENT_AMBULATORY_CARE_PROVIDER_SITE_OTHER): Payer: 59

## 2020-09-24 ENCOUNTER — Ambulatory Visit (INDEPENDENT_AMBULATORY_CARE_PROVIDER_SITE_OTHER): Payer: 59 | Admitting: Orthopaedic Surgery

## 2020-09-24 DIAGNOSIS — R2231 Localized swelling, mass and lump, right upper limb: Secondary | ICD-10-CM | POA: Diagnosis not present

## 2020-09-24 NOTE — Progress Notes (Signed)
   Office Visit Note   Patient: Sarah Rubio           Date of Birth: 21-Jul-1970           MRN: [id number] Visit Date: 09/24/2020              Requested by: Haydee Salter, MD [place],  [place] [address] PCP: Haydee Salter, MD   Assessment & Plan: Visit Diagnoses:  1. Mass of finger of right hand     Plan: Impression is right small finger mass of at least 6 years.  The size has been stable but she no longer wants to continue to live with this and would like to have it surgically excised.  Risk benefits rehab recovery time reviewed with the patient.  Plan will be for surgical pathology once it is fully excised.  Anticipate that she will need to be out of work for at least a couple days.  Today's encounter was performed through an interpreter.  Follow-Up Instructions: Return for postop.   Orders:  Orders Placed This Encounter  Procedures   XR Hand Complete Right   No orders of the defined types were placed in this encounter.     Procedures: No procedures performed   Clinical Data: No additional findings.   Subjective: Chief Complaint  Patient presents with   Right Hand - Pain    Patient is a 50 year old female present with Spanish interpreter today for evaluation of a painless mass on her right small finger.  She is a referral from Dr. Junius Roads.  She has had this for at least 6 years.  She has no pain but she does not like the appearance of it.  It is bothersome to her.   Review of Systems   Objective: Vital Signs: There were no vitals taken for this visit.  Physical Exam  Ortho Exam Right fifth finger shows a soft mass and mobile that transilluminates with light on the dorsal aspect of the proximal phalanx.  There is no neurovascular compromise to the finger.  She has full range of motion. Specialty Comments:  No specialty comments available.  Imaging: XR Hand Complete Right  Result Date: 09/24/2020 No acute or structural  abnormalities.    PMFS History: Patient Active Problem List   Diagnosis Date Noted   Gastroesophageal reflux disease without esophagitis 08/18/2020   Hyperglycemia 07/08/2016   Obesity (BMI 30.0-34.9) 07/08/2016   Mixed hyperlipidemia 07/08/2016   Atypical mole 07/06/2016   Ganglion cyst of finger, right 5th 07/06/2016   Breast lump 03/07/2012   Past Medical History:  Diagnosis Date   Hypertension     Family History  Problem Relation Age of Onset   Diabetes Mother     Past Surgical History:  Procedure Laterality Date   APPENDECTOMY     Social History   Occupational History   Not on file  Tobacco Use   Smoking status: Never   Smokeless tobacco: Never  Substance and Sexual Activity   Alcohol use: No   Drug use: No   Sexual activity: Yes    Birth control/protection: None

## 2020-10-01 ENCOUNTER — Other Ambulatory Visit: Payer: Self-pay

## 2020-10-06 ENCOUNTER — Ambulatory Visit: Payer: 59 | Admitting: Podiatry

## 2020-10-08 ENCOUNTER — Encounter (HOSPITAL_BASED_OUTPATIENT_CLINIC_OR_DEPARTMENT_OTHER): Payer: Self-pay | Admitting: Orthopaedic Surgery

## 2020-10-08 ENCOUNTER — Other Ambulatory Visit: Payer: Self-pay

## 2020-10-15 ENCOUNTER — Encounter (HOSPITAL_BASED_OUTPATIENT_CLINIC_OR_DEPARTMENT_OTHER): Payer: Self-pay | Admitting: Orthopaedic Surgery

## 2020-10-15 ENCOUNTER — Other Ambulatory Visit: Payer: Self-pay

## 2020-10-20 ENCOUNTER — Other Ambulatory Visit: Payer: Self-pay

## 2020-10-20 ENCOUNTER — Encounter: Payer: Self-pay | Admitting: Family Medicine

## 2020-10-20 ENCOUNTER — Ambulatory Visit (INDEPENDENT_AMBULATORY_CARE_PROVIDER_SITE_OTHER): Payer: 59 | Admitting: Family Medicine

## 2020-10-20 VITALS — BP 130/84 | HR 77 | Temp 97.6°F | Ht 62.5 in | Wt 188.0 lb

## 2020-10-20 DIAGNOSIS — E782 Mixed hyperlipidemia: Secondary | ICD-10-CM

## 2020-10-20 DIAGNOSIS — R739 Hyperglycemia, unspecified: Secondary | ICD-10-CM | POA: Diagnosis not present

## 2020-10-20 DIAGNOSIS — R2231 Localized swelling, mass and lump, right upper limb: Secondary | ICD-10-CM

## 2020-10-20 DIAGNOSIS — K219 Gastro-esophageal reflux disease without esophagitis: Secondary | ICD-10-CM | POA: Diagnosis not present

## 2020-10-20 MED ORDER — PANTOPRAZOLE SODIUM 40 MG PO TBEC
40.0000 mg | DELAYED_RELEASE_TABLET | Freq: Every day | ORAL | 3 refills | Status: DC
Start: 2020-10-20 — End: 2021-02-02

## 2020-10-20 NOTE — Progress Notes (Signed)
[facility] [address] [place] [address] Dept: [phone] Dept Fax: [phone]  Office Visit  Subjective:    Patient ID: Sarah Rubio, female    DOB: 22-Aug-1970, 50 y.o..   MRN: [id number]  Chief Complaint  Patient presents with   Follow-up    F/u labs.  She has questions about her BS.     History of Present Illness:  Interpretation for encounter was provided by [facility] [phone])  Patient is in today for reassessment of her GERD. She notes that she is still having heartburn. She tried taking Prilosec, but notes she developed nausea. She has not tried other medications since then.  Sarah Rubio was referred to orthopedics for evaluation of a mass on her right 5th finger. she was initially evaluated by Dr. Junius Roads, who then referred to Dr. Erlinda Hong. He plans to do an excision on 7/27.  Sarah Rubio had blood work at her last visit that showed a mildly elevated HbA1c. She does have a family history of diabetes.  Past Medical History: Patient Active Problem List   Diagnosis Date Noted   Gastroesophageal reflux disease without esophagitis 08/18/2020   Hyperglycemia 07/08/2016   Obesity (BMI 30.0-34.9) 07/08/2016   Mixed hyperlipidemia 07/08/2016   Atypical mole 07/06/2016   Lump on finger, right 5th 07/06/2016   Breast lump 03/07/2012   Past Surgical History:  Procedure Laterality Date   APPENDECTOMY     TUBAL LIGATION     Family History  Problem Relation Age of Onset   Diabetes Mother    Outpatient Medications Prior to Visit  Medication Sig Dispense Refill   acetaminophen (TYLENOL) 325 MG tablet Take 650 mg by mouth every 6 (six) hours as needed.     ibuprofen (ADVIL) 200 MG tablet Take 200 mg by mouth every 6 (six) hours as needed.     omeprazole (PRILOSEC) 20 MG capsule Take 1 capsule (20 mg total) by mouth daily. 30 capsule 3   No facility-administered medications prior to visit.   No Known Allergies     Objective:   Today's Vitals   10/20/20 1106  BP: 130/84  Pulse: 77  Temp: 97.6 F (36.4 C)  TempSrc: Temporal  SpO2: 99%  Weight: 188 lb (85.3 kg)  Height: 5' 2.5" (1.588 m)   Body mass index is 33.84 kg/m.   General: Well developed, well nourished. No acute distress. Psych: Alert and oriented x3. Normal mood and affect.  Health Maintenance Due  Topic Date Due   COVID-19 Vaccine (1) Never done   HIV Screening  Never done   Hepatitis C Screening  Never done   PAP SMEAR-Modifier  Never done   COLONOSCOPY (Pts 45-31yr Insurance coverage will need to be confirmed)  Never done   MAMMOGRAM  Never done   Zoster Vaccines- Shingrix (1 of 2) Never done   Lab Results Lab Results  Component Value Date   CHOL 211 (H) 08/18/2020   HDL 65.30 08/18/2020   LDLCALC 123 (H) 08/18/2020   TRIG 115.0 08/18/2020   CHOLHDL 3 08/18/2020   Lab Results  Component Value Date   HGBA1C 6.6 (H) 08/18/2020     Assessment & Plan:   1. Gastroesophageal reflux disease without esophagitis We will try switching Sarah Rubio to Protonix to see if she tolerates this better. If her symptoms persist and/or she does not tolerate her PPI,. I will consider referral for an EGD to further assess her symptomns.  - pantoprazole (PROTONIX) 40 MG  tablet; Take 1 tablet (40 mg total) by mouth daily.  Dispense: 30 tablet; Refill: 3  2. Hyperglycemia HbA1c is concernign for possible diabetes. I will repeat her test along with a fasting glucose.  - Glucose, random - Hemoglobin A1c  3. Mixed hyperlipidemia Borderline elevation. We will monitor this for now. If her diabetes testing is positive, would conisder starting a statin.  4. Lump on finger, right 5th Scheduled for excisional biopsy.  Haydee Salter, MD

## 2020-10-21 ENCOUNTER — Encounter: Payer: 59 | Admitting: Orthopaedic Surgery

## 2020-10-21 LAB — HEMOGLOBIN A1C: Hgb A1c MFr Bld: 6.5 % (ref 4.6–6.5)

## 2020-10-21 LAB — GLUCOSE, RANDOM: Glucose, Bld: 99 mg/dL (ref 70–99)

## 2020-10-22 ENCOUNTER — Other Ambulatory Visit: Payer: Self-pay

## 2020-10-22 ENCOUNTER — Ambulatory Visit (HOSPITAL_BASED_OUTPATIENT_CLINIC_OR_DEPARTMENT_OTHER): Payer: 59 | Admitting: Anesthesiology

## 2020-10-22 ENCOUNTER — Encounter (HOSPITAL_BASED_OUTPATIENT_CLINIC_OR_DEPARTMENT_OTHER): Payer: Self-pay | Admitting: Orthopaedic Surgery

## 2020-10-22 ENCOUNTER — Encounter (HOSPITAL_BASED_OUTPATIENT_CLINIC_OR_DEPARTMENT_OTHER)
Admission: RE | Disposition: A | Discharge status: Home / Self Care | Payer: Self-pay | Source: Home / Self Care | Attending: Orthopaedic Surgery

## 2020-10-22 ENCOUNTER — Ambulatory Visit (HOSPITAL_BASED_OUTPATIENT_CLINIC_OR_DEPARTMENT_OTHER)
Admission: RE | Admit: 2020-10-22 | Discharge: 2020-10-22 | Disposition: A | Discharge status: Home / Self Care | Payer: 59 | Attending: Orthopaedic Surgery | Admitting: Orthopaedic Surgery

## 2020-10-22 DIAGNOSIS — Z79899 Other long term (current) drug therapy: Secondary | ICD-10-CM | POA: Insufficient documentation

## 2020-10-22 DIAGNOSIS — R2231 Localized swelling, mass and lump, right upper limb: Secondary | ICD-10-CM

## 2020-10-22 DIAGNOSIS — Z888 Allergy status to other drugs, medicaments and biological substances status: Secondary | ICD-10-CM | POA: Diagnosis not present

## 2020-10-22 DIAGNOSIS — D1739 Benign lipomatous neoplasm of skin and subcutaneous tissue of other sites: Secondary | ICD-10-CM | POA: Diagnosis not present

## 2020-10-22 DIAGNOSIS — M659 Synovitis and tenosynovitis, unspecified: Secondary | ICD-10-CM | POA: Diagnosis not present

## 2020-10-22 DIAGNOSIS — R2230 Localized swelling, mass and lump, unspecified upper limb: Secondary | ICD-10-CM | POA: Diagnosis present

## 2020-10-22 HISTORY — PX: CYST EXCISION: SHX5701

## 2020-10-22 HISTORY — DX: Gastro-esophageal reflux disease without esophagitis: K21.9

## 2020-10-22 HISTORY — DX: Localized swelling, mass and lump, right upper limb: R22.31

## 2020-10-22 LAB — POCT PREGNANCY, URINE: Preg Test, Ur: NEGATIVE

## 2020-10-22 SURGERY — CYST REMOVAL
Anesthesia: Monitor Anesthesia Care | Site: Finger | Laterality: Right

## 2020-10-22 MED ORDER — FENTANYL CITRATE (PF) 100 MCG/2ML IJ SOLN: 25.0000 ug | INTRAMUSCULAR | Status: DC | PRN

## 2020-10-22 MED ORDER — PROPOFOL 10 MG/ML IV BOLUS
INTRAVENOUS | Status: DC | PRN
  Administered 2020-10-22: 40 mg via INTRAVENOUS

## 2020-10-22 MED ORDER — OXYCODONE HCL 5 MG PO TABS: 5.0000 mg | ORAL_TABLET | Freq: Once | ORAL | Status: DC | PRN

## 2020-10-22 MED ORDER — PROMETHAZINE HCL 25 MG/ML IJ SOLN: 6.2500 mg | INTRAMUSCULAR | Status: DC | PRN

## 2020-10-22 MED ORDER — LIDOCAINE HCL 1 % IJ SOLN
INTRAMUSCULAR | Status: DC | PRN
  Administered 2020-10-22: 4 mL via INTRAMUSCULAR

## 2020-10-22 MED ORDER — OXYCODONE HCL 5 MG/5ML PO SOLN: 5.0000 mg | Freq: Once | ORAL | Status: DC | PRN

## 2020-10-22 MED ORDER — LIDOCAINE HCL (PF) 1 % IJ SOLN
INTRAMUSCULAR | Status: AC
  Filled 2020-10-22: qty 30

## 2020-10-22 MED ORDER — ONDANSETRON HCL 4 MG/2ML IJ SOLN
INTRAMUSCULAR | Status: DC | PRN
  Administered 2020-10-22: 4 mg via INTRAVENOUS

## 2020-10-22 MED ORDER — TRAMADOL HCL 50 MG PO TABS: 50.0000 mg | ORAL_TABLET | Freq: Every day | ORAL | 0 refills | Status: DC | PRN

## 2020-10-22 MED ORDER — CEFAZOLIN SODIUM-DEXTROSE 2-4 GM/100ML-% IV SOLN
2.0000 g | INTRAVENOUS | Status: AC
  Administered 2020-10-22: 2 g via INTRAVENOUS

## 2020-10-22 MED ORDER — ONDANSETRON HCL 4 MG/2ML IJ SOLN
INTRAMUSCULAR | Status: AC
  Filled 2020-10-22: qty 2

## 2020-10-22 MED ORDER — MIDAZOLAM HCL 2 MG/2ML IJ SOLN
INTRAMUSCULAR | Status: AC
  Filled 2020-10-22: qty 2

## 2020-10-22 MED ORDER — FENTANYL CITRATE (PF) 100 MCG/2ML IJ SOLN
INTRAMUSCULAR | Status: DC | PRN
  Administered 2020-10-22: 100 ug via INTRAVENOUS

## 2020-10-22 MED ORDER — LACTATED RINGERS IV SOLN: INTRAVENOUS | Status: DC

## 2020-10-22 MED ORDER — BUPIVACAINE-EPINEPHRINE (PF) 0.5% -1:200000 IJ SOLN
INTRAMUSCULAR | Status: AC
  Filled 2020-10-22: qty 90

## 2020-10-22 MED ORDER — MIDAZOLAM HCL 5 MG/5ML IJ SOLN
INTRAMUSCULAR | Status: DC | PRN
  Administered 2020-10-22: 2 mg via INTRAVENOUS

## 2020-10-22 MED ORDER — LIDOCAINE HCL (CARDIAC) PF 100 MG/5ML IV SOSY
PREFILLED_SYRINGE | INTRAVENOUS | Status: DC | PRN
  Administered 2020-10-22: 40 mg via INTRAVENOUS

## 2020-10-22 MED ORDER — FENTANYL CITRATE (PF) 100 MCG/2ML IJ SOLN
INTRAMUSCULAR | Status: AC
  Filled 2020-10-22: qty 2

## 2020-10-22 SURGICAL SUPPLY — 51 items
ADH SKN CLS APL DERMABOND .7 (GAUZE/BANDAGES/DRESSINGS)
BAND INSRT 18 STRL LF DISP RB (MISCELLANEOUS) ×2
BAND RUBBER #18 3X1/16 STRL (MISCELLANEOUS) ×4 IMPLANT
BLADE ARTHRO LOK 4 BEAVER (BLADE) IMPLANT
BLADE SURG 15 STRL LF DISP TIS (BLADE) ×2 IMPLANT
BLADE SURG 15 STRL SS (BLADE) ×4
BNDG CMPR 9X4 STRL LF SNTH (GAUZE/BANDAGES/DRESSINGS) ×1
BNDG COHESIVE 1X5 TAN STRL LF (GAUZE/BANDAGES/DRESSINGS) ×2 IMPLANT
BNDG CONFORM 2 STRL LF (GAUZE/BANDAGES/DRESSINGS) IMPLANT
BNDG ELASTIC 3X5.8 VLCR STR LF (GAUZE/BANDAGES/DRESSINGS) IMPLANT
BNDG ESMARK 4X9 LF (GAUZE/BANDAGES/DRESSINGS) ×2 IMPLANT
BRUSH SCRUB EZ PLAIN DRY (MISCELLANEOUS) ×2 IMPLANT
CORD BIPOLAR FORCEPS 12FT (ELECTRODE) ×2 IMPLANT
COVER BACK TABLE 60X90IN (DRAPES) ×2 IMPLANT
COVER MAYO STAND STRL (DRAPES) ×2 IMPLANT
CUFF TOURN SGL QUICK 18X4 (TOURNIQUET CUFF) ×2 IMPLANT
DECANTER SPIKE VIAL GLASS SM (MISCELLANEOUS) IMPLANT
DERMABOND ADVANCED (GAUZE/BANDAGES/DRESSINGS)
DERMABOND ADVANCED .7 DNX12 (GAUZE/BANDAGES/DRESSINGS) IMPLANT
DRAPE EXTREMITY T 121X128X90 (DISPOSABLE) ×2 IMPLANT
DRAPE IMP U-DRAPE 54X76 (DRAPES) ×2 IMPLANT
DRAPE SURG 17X23 STRL (DRAPES) ×2 IMPLANT
GAUZE 4X4 16PLY ~~LOC~~+RFID DBL (SPONGE) IMPLANT
GAUZE SPONGE 4X4 12PLY STRL (GAUZE/BANDAGES/DRESSINGS) ×2 IMPLANT
GAUZE XEROFORM 1X8 LF (GAUZE/BANDAGES/DRESSINGS) ×2 IMPLANT
GLOVE SURG NEOP MICRO LF SZ7.5 (GLOVE) ×2 IMPLANT
GLOVE SURG POLYISO LF SZ7 (GLOVE) ×2 IMPLANT
GLOVE SURG SYN 7.5  E (GLOVE) ×4
GLOVE SURG SYN 7.5 E (GLOVE) ×2 IMPLANT
GLOVE SURG UNDER POLY LF SZ7 (GLOVE) ×6 IMPLANT
GLOVE SURG UNDER POLY LF SZ7.5 (GLOVE) ×2 IMPLANT
GOWN STRL REIN XL XLG (GOWN DISPOSABLE) ×2 IMPLANT
GOWN STRL REUS W/ TWL LRG LVL3 (GOWN DISPOSABLE) ×1 IMPLANT
GOWN STRL REUS W/ TWL XL LVL3 (GOWN DISPOSABLE) ×2 IMPLANT
GOWN STRL REUS W/TWL LRG LVL3 (GOWN DISPOSABLE) ×2
GOWN STRL REUS W/TWL XL LVL3 (GOWN DISPOSABLE) ×4
NEEDLE HYPO 25X1 1.5 SAFETY (NEEDLE) ×2 IMPLANT
NS IRRIG 1000ML POUR BTL (IV SOLUTION) ×2 IMPLANT
PACK BASIN DAY SURGERY FS (CUSTOM PROCEDURE TRAY) ×2 IMPLANT
SHEET MEDIUM DRAPE 40X70 STRL (DRAPES) ×2 IMPLANT
STOCKINETTE 4X48 STRL (DRAPES) ×2 IMPLANT
SUT ETHILON 2 0 FS 18 (SUTURE) IMPLANT
SUT ETHILON 4 0 PS 2 18 (SUTURE) ×2 IMPLANT
SUT VIC AB 0 CT1 27 (SUTURE)
SUT VIC AB 0 CT1 27XBRD ANBCTR (SUTURE) IMPLANT
SUT VIC AB 2-0 CT1 27 (SUTURE)
SUT VIC AB 2-0 CT1 TAPERPNT 27 (SUTURE) IMPLANT
SYR BULB EAR ULCER 3OZ GRN STR (SYRINGE) ×2 IMPLANT
SYR CONTROL 10ML LL (SYRINGE) ×2 IMPLANT
TOWEL GREEN STERILE FF (TOWEL DISPOSABLE) ×2 IMPLANT
TRAY DSU PREP LF (CUSTOM PROCEDURE TRAY) ×2 IMPLANT

## 2020-10-22 NOTE — Anesthesia Preprocedure Evaluation (Addendum)
Anesthesia Evaluation  Patient identified by MRN, date of birth, ID band Patient awake    Reviewed: Allergy & Precautions, NPO status , Patient's Chart, lab work & pertinent test results  History of Anesthesia Complications Negative for: history of anesthetic complications  Airway Mallampati: II  TM Distance: >3 FB Neck ROM: Full    Dental  (+) Dental Advisory Given, Caps   Pulmonary neg pulmonary ROS,    Pulmonary exam normal        Cardiovascular negative cardio ROS Normal cardiovascular exam     Neuro/Psych negative neurological ROS  negative psych ROS   GI/Hepatic Neg liver ROS, GERD  Medicated and Controlled,  Endo/Other   Obesity   Renal/GU negative Renal ROS     Musculoskeletal negative musculoskeletal ROS (+)   Abdominal   Peds  Hematology negative hematology ROS (+)   Anesthesia Other Findings   Reproductive/Obstetrics  s/p tubal ligation                             Anesthesia Physical Anesthesia Plan  ASA: 2  Anesthesia Plan: MAC   Post-op Pain Management:    Induction:   PONV Risk Score and Plan: 2 and Propofol infusion and Treatment may vary due to age or medical condition  Airway Management Planned: Natural Airway and Simple Face Mask  Additional Equipment: None  Intra-op Plan:   Post-operative Plan:   Informed Consent: I have reviewed the patients History and Physical, chart, labs and discussed the procedure including the risks, benefits and alternatives for the proposed anesthesia with the patient or authorized representative who has indicated his/her understanding and acceptance.     Interpreter used for [employer] Discussed with: CRNA and Anesthesiologist  Anesthesia Plan Comments:        Anesthesia Quick Evaluation

## 2020-10-22 NOTE — Discharge Instructions (Addendum)
Postoperative instructions:  Keep your dressing and/or splint clean and dry at all times.  You can remove your dressing on post-operative day #3 and change with a dry/sterile dressing or Band-Aids as needed thereafter.    Incision instructions:  Do not soak your incision for 3 weeks after surgery.  If the incision gets wet, pat dry and do not scrub the incision.  Pain control:  You have been given a prescription to be taken as directed for post-operative pain control.  In addition, elevate the operative extremity above the heart at all times to prevent swelling and throbbing pain.  Take over-the-counter Colace, '100mg'$  by mouth twice a day while taking narcotic pain medications to help prevent constipation.  Follow up appointments: 1) 14 days for suture removal and wound check. 2) Dr. Erlinda Hong as scheduled.   -------------------------------------------------------------------------------------------------------------  After Surgery Pain Control:  After your surgery, post-surgical discomfort or pain is likely. This discomfort can last several days to a few weeks. At certain times of the day your discomfort may be more intense.  Did you receive a nerve block?  A nerve block can provide pain relief for one hour to two days after your surgery. As long as the nerve block is working, you will experience little or no sensation in the area the surgeon operated on.  As the nerve block wears off, you will begin to experience pain or discomfort. It is very important that you begin taking your prescribed pain medication before the nerve block fully wears off. Treating your pain at the first sign of the block wearing off will ensure your pain is better controlled and more tolerable when full-sensation returns. Do not wait until the pain is intolerable, as the medicine will be less effective. It is better to treat pain in advance than to try and catch up.  General Anesthesia:  If you did not receive a nerve  block during your surgery, you will need to start taking your pain medication shortly after your surgery and should continue to do so as prescribed by your surgeon.  Pain Medication:  Most commonly we prescribe Vicodin and Percocet for post-operative pain. Both of these medications contain a combination of acetaminophen (Tylenol) and a narcotic to help control pain.   It takes between 30 and 45 minutes before pain medication starts to work. It is important to take your medication before your pain level gets too intense.   Nausea is a common side effect of many pain medications. You will want to eat something before taking your pain medicine to help prevent nausea.   If you are taking a prescription pain medication that contains acetaminophen, we recommend that you do not take additional over the counter acetaminophen (Tylenol).  Other pain relieving options:   Using a cold pack to ice the affected area a few times a day (15 to 20 minutes at a time) can help to relieve pain, reduce swelling and bruising.   Elevation of the affected area can also help to reduce pain and swelling.       Post Anesthesia Home Care Instructions  Activity: Get plenty of rest for the remainder of the day. A responsible individual must stay with you for 24 hours following the procedure.  For the next 24 hours, DO NOT: -Drive a car -Paediatric nurse -Drink alcoholic beverages -Take any medication unless instructed by your physician -Make any legal decisions or sign important papers.  Meals: Start with liquid foods such as gelatin or soup. Progress  to regular foods as tolerated. Avoid greasy, spicy, heavy foods. If nausea and/or vomiting occur, drink only clear liquids until the nausea and/or vomiting subsides. Call your physician if vomiting continues.  Special Instructions/Symptoms: Your throat may feel dry or sore from the anesthesia or the breathing tube placed in your throat during surgery. If this  causes discomfort, gargle with warm salt water. The discomfort should disappear within 24 hours.  If you had a scopolamine patch placed behind your ear for the management of post- operative nausea and/or vomiting:  1. The medication in the patch is effective for 72 hours, after which it should be removed.  Wrap patch in a tissue and discard in the trash. Wash hands thoroughly with soap and water. 2. You may remove the patch earlier than 72 hours if you experience unpleasant side effects which may include dry mouth, dizziness or visual disturbances. 3. Avoid touching the patch. Wash your hands with soap and water after contact with the patch.

## 2020-10-22 NOTE — Anesthesia Postprocedure Evaluation (Signed)
Anesthesia Post Note  Patient: Sarah Rubio  Procedure(s) Performed: EXCISION RIGHT FIFTH FINGER MASS (Right: Finger)     Patient location during evaluation: PACU Anesthesia Type: MAC Level of consciousness: awake and alert Pain management: pain level controlled Vital Signs Assessment: post-procedure vital signs reviewed and stable Respiratory status: spontaneous breathing, nonlabored ventilation and respiratory function stable Cardiovascular status: stable and blood pressure returned to baseline Anesthetic complications: no   No notable events documented.  Last Vitals:  Vitals:   10/22/20 1330 10/22/20 1400  BP: 116/79 136/77  Pulse: 62 60  Resp: 20 18  Temp:  36.6 C  SpO2: 95% 97%    Last Pain:  Vitals:   10/22/20 1400  TempSrc:   PainSc: 0-No pain                 Audry Pili

## 2020-10-22 NOTE — Transfer of Care (Signed)
Immediate Anesthesia Transfer of Care Note  Patient: Sarah Rubio  Procedure(s) Performed: EXCISION RIGHT FIFTH FINGER MASS (Right: Finger)  Patient Location: PACU  Anesthesia Type:MAC  Level of Consciousness: awake  Airway & Oxygen Therapy: Patient Spontanous Breathing and Patient connected to face mask oxygen  Post-op Assessment: Report given to RN and Post -op Vital signs reviewed and stable  Post vital signs: Reviewed and stable  Last Vitals:  Vitals Value Taken Time  BP    Temp    Pulse    Resp 11 10/22/20 1317  SpO2      Last Pain:  Vitals:   10/22/20 1224  TempSrc: Oral  PainSc: 0-No pain         Complications: No notable events documented.

## 2020-10-22 NOTE — Op Note (Addendum)
   Date of Surgery: 10/22/2020  INDICATIONS: Ms. Silveira is a 50 y.o.-year-old female with a right small finger mass that failed conservative treatment.  The Patient did consent to the procedure after discussion of the risks and benefits.  PREOPERATIVE DIAGNOSIS: Right little finger mass  POSTOPERATIVE DIAGNOSIS: Same.  PROCEDURE:  Surgical excision of right little finger mass 1.5 cm x 1 cm Tenolysis of right small finger extensor tendon  SURGEON: N. Eduard Roux, M.D.  ASSIST: Ciro Backer [place], [place]; necessary for the timely completion of procedure and due to complexity of procedure.  ANESTHESIA: MAC and local  IV FLUIDS AND URINE: See anesthesia.  ESTIMATED BLOOD LOSS: Minimal mL.  IMPLANTS: None  DRAINS: None  COMPLICATIONS: see description of procedure.  DESCRIPTION OF PROCEDURE: The patient was brought to the operating room.  The patient had been signed prior to the procedure and this was documented. The patient had the anesthesia placed by the anesthesiologist.  A local digital block was administered.  A time-out was performed to confirm that this was the correct patient, site, side and location. The patient did receive antibiotics prior to the incision and was re-dosed during the procedure as needed at indicated intervals.  A tourniquet was placed.  The patient had the operative extremity prepped and draped in the standard surgical fashion.    A vertical incision was made directly over the palpable mass which was on the dorsal aspect of the small finger overlying the proximal phalanx.  Dissection was carried bluntly onto the mass.  Pressure was applied circumferentially which delivered the mass through the surgical incision.  The gross appearance was consistent with a lipoma.  The mass was removed entirely and sent for pathology.  There was moderate tenosynovitis of the extensor tendon therefore tenolysis was performed using tenotomy scissors.  There were no is local  aggressive or erosive changes.  This was then thoroughly irrigated and the skin was closed with interrupted nylon sutures.  Sterile dressings were applied.  Patient tolerated procedure well had no immediate complications.  Tawanna Cooler was necessary for opening, closing, retracting, limb positioning and overall facilitation and timely completion of the procedure.  POSTOPERATIVE PLAN: Patient will be discharged home and follow-up in 2 weeks for suture removal.  N. Eduard Roux, MD 1:04 PM

## 2020-10-22 NOTE — H&P (Signed)
    PREOPERATIVE H&P  Chief Complaint: right 5th finger mass  HPI: Sarah Rubio is a 50 y.o. female who presents for surgical treatment of right 5th finger mass.  She denies any changes in medical history.  Past Medical History:  Diagnosis Date   GERD (gastroesophageal reflux disease)    Mass of finger of right hand    5th finger   Past Surgical History:  Procedure Laterality Date   APPENDECTOMY     TUBAL LIGATION     Social History   Socioeconomic History   Marital status: Widowed    Spouse name: Not on file   Number of children: Not on file   Years of education: Not on file   Highest education level: Not on file  Occupational History   Not on file  Tobacco Use   Smoking status: Never   Smokeless tobacco: Never  Substance and Sexual Activity   Alcohol use: No   Drug use: No   Sexual activity: Yes    Birth control/protection: Surgical    Comment: BTL  Other Topics Concern   Not on file  Social History Narrative   Not on file   Social Determinants of Health   Financial Resource Strain: Not on file  Food Insecurity: Not on file  Transportation Needs: Not on file  Physical Activity: Not on file  Stress: Not on file  Social Connections: Not on file   Family History  Problem Relation Age of Onset   Diabetes Mother    Allergies  Allergen Reactions   Prilosec [Omeprazole] Nausea Only   Prior to Admission medications   Medication Sig Start Date End Date Taking? Authorizing Provider  pantoprazole (PROTONIX) 40 MG tablet Take 1 tablet (40 mg total) by mouth daily. 10/20/20  Yes Haydee Salter, MD  acetaminophen (TYLENOL) 325 MG tablet Take 650 mg by mouth every 6 (six) hours as needed.    [provider]  ibuprofen (ADVIL) 200 MG tablet Take 200 mg by mouth every 6 (six) hours as needed.    [provider]     Positive ROS: All other systems have been reviewed and were otherwise negative with the exception of those mentioned in the HPI  and as above.  Physical Exam: General: Alert, no acute distress Cardiovascular: No pedal edema Respiratory: No cyanosis, no use of accessory musculature GI: abdomen soft Skin: No lesions in the area of chief complaint Neurologic: Sensation intact distally Psychiatric: Patient is competent for consent with normal mood and affect Lymphatic: no lymphedema  MUSCULOSKELETAL: exam stable  Assessment: right 5th finger mass  Plan: Plan for Procedure(s): EXCISION RIGHT FIFTH FINGER MASS  The risks benefits and alternatives were discussed with the patient including but not limited to the risks of nonoperative treatment, versus surgical intervention including infection, bleeding, nerve injury,  blood clots, cardiopulmonary complications, morbidity, mortality, among others, and they were willing to proceed.   Preoperative templating of the joint replacement has been completed, documented, and submitted to the Operating Room personnel in order to optimize intra-operative equipment management.   Eduard Roux, MD 10/22/2020 12:17 PM

## 2020-10-23 ENCOUNTER — Telehealth: Payer: Self-pay

## 2020-10-23 ENCOUNTER — Encounter (HOSPITAL_BASED_OUTPATIENT_CLINIC_OR_DEPARTMENT_OTHER): Payer: Self-pay | Admitting: Orthopaedic Surgery

## 2020-10-23 ENCOUNTER — Telehealth: Payer: Self-pay | Admitting: Orthopaedic Surgery

## 2020-10-23 ENCOUNTER — Other Ambulatory Visit: Payer: Self-pay | Admitting: Physician Assistant

## 2020-10-23 LAB — SURGICAL PATHOLOGY

## 2020-10-23 MED ORDER — TRAMADOL HCL 50 MG PO TABS: 50.0000 mg | ORAL_TABLET | Freq: Every day | ORAL | 0 refills | Status: DC | PRN

## 2020-10-23 NOTE — Telephone Encounter (Signed)
Patient called she is requesting a out of work note patient also stated she went to the pharmacy to pick up her rx for tramadol and the pharmacists told her they didn't receive anything from [place] call back:[phone]

## 2020-10-23 NOTE — Telephone Encounter (Signed)
Duplicate message. 

## 2020-10-23 NOTE — Telephone Encounter (Signed)
Looks like xu sent it in on 7/27  but I just resent

## 2020-10-23 NOTE — Telephone Encounter (Signed)
Patient called. She would like a work note. Her call back number is [phone]

## 2020-10-24 NOTE — Telephone Encounter (Signed)
Not sure what she does for work, but at least out of work until follow up appointment

## 2020-10-24 NOTE — Telephone Encounter (Signed)
Note ready for pick up

## 2020-10-27 ENCOUNTER — Telehealth: Payer: Self-pay | Admitting: Family Medicine

## 2020-10-27 NOTE — Telephone Encounter (Signed)
Please return call to pt (no interpreter needed).  Ph # [phone]

## 2020-10-27 NOTE — Telephone Encounter (Signed)
Patient notified [place] phone. Questions answered.  Dm/cma

## 2020-10-29 ENCOUNTER — Encounter: Payer: Self-pay | Admitting: Physician Assistant

## 2020-10-29 ENCOUNTER — Ambulatory Visit (INDEPENDENT_AMBULATORY_CARE_PROVIDER_SITE_OTHER): Payer: 59 | Admitting: Physician Assistant

## 2020-10-29 ENCOUNTER — Other Ambulatory Visit: Payer: Self-pay

## 2020-10-29 ENCOUNTER — Other Ambulatory Visit: Payer: Self-pay | Admitting: Physician Assistant

## 2020-10-29 DIAGNOSIS — R2231 Localized swelling, mass and lump, right upper limb: Secondary | ICD-10-CM

## 2020-10-29 MED ORDER — HYDROCODONE-ACETAMINOPHEN 5-325 MG PO TABS: 1.0000 | ORAL_TABLET | Freq: Two times a day (BID) | ORAL | 0 refills | Status: DC | PRN

## 2020-10-29 NOTE — Progress Notes (Signed)
   Post-Op Visit Note   Patient: Sarah Rubio           Date of Birth: 1970-07-27           MRN: [id number] Visit Date: 10/29/2020 PCP: Haydee Salter, MD   Assessment & Plan:  Chief Complaint:  Chief Complaint  Patient presents with   Right Hand - Routine Post Op   Visit Diagnoses: No diagnosis found.  Plan: Patient is a pleasant 50 year old Spanish-speaking female who comes in today with interpreter.  She is 1 week status post right small finger mass excision, date of surgery 10/22/2020.  Pathology came back showing a lipoma.  She has been doing well.  She is still having some pain which is relieved with Norco.  Examination of her right small finger reveals well-healing surgical incision with nylon sutures in place.  No evidence of infection or cellulitis.  She is neurovascular intact distally.  Today, her wound was cleaned and a Band-Aid was applied.  No submerging her hand in water or heavy lifting.  Work note provided to be out for another week.  She will follow-up with [place] in 1 week's time for suture removal.  Call with concerns or questions in the meantime.  Follow-Up Instructions: No follow-ups on file.   Orders:  No orders of the defined types were placed in this encounter.  No orders of the defined types were placed in this encounter.   Imaging: No new imaging  PMFS History: Patient Active Problem List   Diagnosis Date Noted   Mass of finger of right hand 10/22/2020   Gastroesophageal reflux disease without esophagitis 08/18/2020   Hyperglycemia 07/08/2016   Obesity (BMI 30.0-34.9) 07/08/2016   Mixed hyperlipidemia 07/08/2016   Atypical mole 07/06/2016   Breast lump 03/07/2012   Past Medical History:  Diagnosis Date   GERD (gastroesophageal reflux disease)    Mass of finger of right hand    5th finger    Family History  Problem Relation Age of Onset   Diabetes Mother     Past Surgical History:  Procedure Laterality Date   APPENDECTOMY     CYST EXCISION  Right 10/22/2020   Procedure: EXCISION RIGHT FIFTH FINGER MASS;  Surgeon: Leandrew Koyanagi, MD;  Location: [facility];  Service: Orthopedics;  Laterality: Right;   TUBAL LIGATION     Social History   Occupational History   Not on file  Tobacco Use   Smoking status: Never   Smokeless tobacco: Never  Substance and Sexual Activity   Alcohol use: No   Drug use: No   Sexual activity: Yes    Birth control/protection: Surgical    Comment: BTL

## 2020-11-05 ENCOUNTER — Other Ambulatory Visit: Payer: Self-pay

## 2020-11-05 ENCOUNTER — Ambulatory Visit (INDEPENDENT_AMBULATORY_CARE_PROVIDER_SITE_OTHER): Payer: 59 | Admitting: Physician Assistant

## 2020-11-05 DIAGNOSIS — R2231 Localized swelling, mass and lump, right upper limb: Secondary | ICD-10-CM

## 2020-11-05 MED ORDER — TRAMADOL HCL 50 MG PO TABS: 50.0000 mg | ORAL_TABLET | Freq: Every day | ORAL | 0 refills | Status: DC | PRN

## 2020-11-05 NOTE — Progress Notes (Signed)
   Post-Op Visit Note   Patient: Sarah Rubio           Date of Birth: [birth year]           MRN: [id number] Visit Date: 11/05/2020 PCP: Haydee Salter, MD   Assessment & Plan:  Chief Complaint:  Chief Complaint  Patient presents with   Right Hand - Pain   Visit Diagnoses:  1. Mass of finger of right hand     Plan: Patient is a pleasant 50 year old female who comes in today 2 weeks status post right small finger mass excision, date of surgery 10/22/2020.  Pathology came back as a lipoma.  She has been doing well.  She notes some soreness which is relieved with narcotic pain medication.  She has not returned to work yet as she has a very physical job where she have to lift quite often.  Examination of her right small finger reveals a well-healed surgical incision with nylon sutures in place.  No evidence of infection or cellulitis.  Fingers warm well perfused.  She is neurovascular intact distally.  Today, sutures were removed and Steri-Strips applied.  We will provide her with a new work note to be out for another 2 weeks.  She will follow-up with [place] at that point in time for repeat evaluation.  Call with concerns or questions in the meantime.  Follow-Up Instructions: Return in about 2 weeks (around 11/19/2020).   Orders:  No orders of the defined types were placed in this encounter.  No orders of the defined types were placed in this encounter.   Imaging: No new imaging  PMFS History: Patient Active Problem List   Diagnosis Date Noted   Mass of finger of right hand 10/22/2020   Gastroesophageal reflux disease without esophagitis 08/18/2020   Hyperglycemia 07/08/2016   Obesity (BMI 30.0-34.9) 07/08/2016   Mixed hyperlipidemia 07/08/2016   Atypical mole 07/06/2016   Breast lump 03/07/2012   Past Medical History:  Diagnosis Date   GERD (gastroesophageal reflux disease)    Mass of finger of right hand    5th finger    Family History  Problem Relation Age of Onset    Diabetes Mother     Past Surgical History:  Procedure Laterality Date   APPENDECTOMY     CYST EXCISION Right 10/22/2020   Procedure: EXCISION RIGHT FIFTH FINGER MASS;  Surgeon: Leandrew Koyanagi, MD;  Location: [facility];  Service: Orthopedics;  Laterality: Right;   TUBAL LIGATION     Social History   Occupational History   Not on file  Tobacco Use   Smoking status: Never   Smokeless tobacco: Never  Substance and Sexual Activity   Alcohol use: No   Drug use: No   Sexual activity: Yes    Birth control/protection: Surgical    Comment: BTL

## 2020-11-19 ENCOUNTER — Other Ambulatory Visit: Payer: Self-pay

## 2020-11-19 ENCOUNTER — Ambulatory Visit (INDEPENDENT_AMBULATORY_CARE_PROVIDER_SITE_OTHER): Payer: 59 | Admitting: Physician Assistant

## 2020-11-19 ENCOUNTER — Encounter: Payer: Self-pay | Admitting: Orthopaedic Surgery

## 2020-11-19 ENCOUNTER — Telehealth: Payer: Self-pay

## 2020-11-19 DIAGNOSIS — R2231 Localized swelling, mass and lump, right upper limb: Secondary | ICD-10-CM

## 2020-11-19 NOTE — Telephone Encounter (Signed)
See message.  Thanks.

## 2020-11-19 NOTE — Progress Notes (Signed)
   Post-Op Visit Note   Patient: Sarah Rubio           Date of Birth: Jun 20, 1970           MRN: [id number] Visit Date: 11/19/2020 PCP: Haydee Salter, MD   Assessment & Plan:  Chief Complaint:  Chief Complaint  Patient presents with   Right Hand - Follow-up   Visit Diagnoses:  1. Mass of finger of right hand     Plan: Patient is a pleasant 50 year old Spanish-speaking female who comes in today 4 weeks status post right small finger mass excision, date of surgery 10/22/2020.  She is here with the interpreter.  She has been doing well.  She has some burning sensation to the small finger when showering but nothing more.  Examination of the small finger reveals a fully healed surgical scar without complication.  She does have slight limitation with making a full composite fist.  She has neurovascular intact distally.  At this point, she will continue to advance with activity as tolerated.  We will allow her to return to work next Tuesday, 11/25/2020 full duty but we will write a note stating that she may not be able to perform all of her duties in a timely manner for the next 4 weeks.  She will follow-up with [place] as needed.  Call with concerns or questions in the meantime.  Follow-Up Instructions: Return if symptoms worsen or fail to improve.   Orders:  No orders of the defined types were placed in this encounter.  No orders of the defined types were placed in this encounter.   Imaging: No new imaging  PMFS History: Patient Active Problem List   Diagnosis Date Noted   Mass of finger of right hand 10/22/2020   Gastroesophageal reflux disease without esophagitis 08/18/2020   Hyperglycemia 07/08/2016   Obesity (BMI 30.0-34.9) 07/08/2016   Mixed hyperlipidemia 07/08/2016   Atypical mole 07/06/2016   Breast lump 03/07/2012   Past Medical History:  Diagnosis Date   GERD (gastroesophageal reflux disease)    Mass of finger of right hand    5th finger    Family History  Problem  Relation Age of Onset   Diabetes Mother     Past Surgical History:  Procedure Laterality Date   APPENDECTOMY     CYST EXCISION Right 10/22/2020   Procedure: EXCISION RIGHT FIFTH FINGER MASS;  Surgeon: Leandrew Koyanagi, MD;  Location: [facility];  Service: Orthopedics;  Laterality: Right;   TUBAL LIGATION     Social History   Occupational History   Not on file  Tobacco Use   Smoking status: Never   Smokeless tobacco: Never  Substance and Sexual Activity   Alcohol use: No   Drug use: No   Sexual activity: Yes    Birth control/protection: Surgical    Comment: BTL

## 2020-11-19 NOTE — Telephone Encounter (Signed)
Pts daughter came into the office regarding the note the pt was given at there appt today.  Pts work is saying that the note needs more detail as to what the patient can and can't do.

## 2020-11-20 ENCOUNTER — Telehealth: Payer: Self-pay | Admitting: Orthopaedic Surgery

## 2020-11-20 NOTE — Telephone Encounter (Signed)
Called to advise. She want [place] to put what can she do and not do. Please advise.

## 2020-11-20 NOTE — Telephone Encounter (Signed)
Sure let's do 20/hr

## 2020-11-20 NOTE — Telephone Encounter (Signed)
She can do everything.

## 2020-11-20 NOTE — Telephone Encounter (Signed)
Pt called back wondering when her not will be ready??   CB [phone]

## 2020-11-20 NOTE — Telephone Encounter (Signed)
Sarah Rubio, can you call back and write note for whatever she needs it to say for the next 4 weeks

## 2020-11-20 NOTE — Telephone Encounter (Signed)
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 Right now she cannot do it that fast. Can we minimize how many she does an hour.

## 2020-11-21 NOTE — Telephone Encounter (Signed)
Note ready for pick up. Patient aware.

## 2020-11-21 NOTE — Telephone Encounter (Signed)
Sounds good

## 2020-11-24 ENCOUNTER — Encounter: Payer: 59 | Admitting: Family Medicine

## 2020-12-29 ENCOUNTER — Encounter: Payer: 59 | Admitting: Family Medicine

## 2021-02-02 ENCOUNTER — Other Ambulatory Visit: Payer: Self-pay

## 2021-02-02 ENCOUNTER — Encounter: Payer: Self-pay | Admitting: Family Medicine

## 2021-02-02 ENCOUNTER — Ambulatory Visit (INDEPENDENT_AMBULATORY_CARE_PROVIDER_SITE_OTHER): Payer: 59 | Admitting: Family Medicine

## 2021-02-02 VITALS — BP 142/84 | HR 82 | Temp 97.5°F | Ht 62.0 in | Wt 181.4 lb

## 2021-02-02 DIAGNOSIS — Z6833 Body mass index (BMI) 33.0-33.9, adult: Secondary | ICD-10-CM

## 2021-02-02 DIAGNOSIS — K219 Gastro-esophageal reflux disease without esophagitis: Secondary | ICD-10-CM

## 2021-02-02 DIAGNOSIS — R739 Hyperglycemia, unspecified: Secondary | ICD-10-CM

## 2021-02-02 DIAGNOSIS — B351 Tinea unguium: Secondary | ICD-10-CM

## 2021-02-02 DIAGNOSIS — Z1231 Encounter for screening mammogram for malignant neoplasm of breast: Secondary | ICD-10-CM

## 2021-02-02 DIAGNOSIS — Z124 Encounter for screening for malignant neoplasm of cervix: Secondary | ICD-10-CM

## 2021-02-02 MED ORDER — PANTOPRAZOLE SODIUM 40 MG PO TBEC: 40.0000 mg | DELAYED_RELEASE_TABLET | Freq: Every day | ORAL | 3 refills | Status: AC

## 2021-02-02 NOTE — Progress Notes (Signed)
[facility] [address] [place] [address] Dept: [phone] Dept Fax: [phone]  Chronic Care Office Visit  Subjective:    Patient ID: Sarah Rubio, female    DOB: 1970/03/31, 49 y.o..   MRN: [id number]  Chief Complaint  Patient presents with   Gastroesophageal Reflux    History of Present Illness:  Patient is in today for reassessment of chronic medical issues. Her husband, Sarah Rubio, assisted with interpretation.  Patient is in today for reassessment of her GERD. At her last visit, we started her on Protonix. She finds this is working much better for control of her.   Since her last visit, Sarah Rubio had an excision of a mass on her right 5th finger. The pathology showed this to be a lipoma. She is pleased with the results of the procedure.   Sarah Rubio has had an A1c that is borderline for diabetes, but with normal fasting blood sugars. She has made some dietary changes to increase vegetable consumption and reduce sugars (such as not drinking sweet tea). She is also looking for opportunities to increase her physical activity.   Sarah Rubio has a history of onychomycosis. She notes she would like to see a podiatrist about management of this.  Past Medical History: Patient Active Problem List   Diagnosis Date Noted   Onychomycosis 02/02/2021   Mass of finger of right hand 10/22/2020   Gastroesophageal reflux disease without esophagitis 08/18/2020   Hyperglycemia 07/08/2016   BMI 33.0-33.9,adult 07/08/2016   Mixed hyperlipidemia 07/08/2016   Atypical mole 07/06/2016   Breast lump 03/07/2012   Past Surgical History:  Procedure Laterality Date   APPENDECTOMY     CYST EXCISION Right 10/22/2020   Procedure: EXCISION RIGHT FIFTH FINGER MASS;  Surgeon: Leandrew Koyanagi, MD;  Location: [facility];  Service: Orthopedics;  Laterality: Right;   TUBAL LIGATION     Family History  Problem Relation Age  of Onset   Diabetes Mother    Outpatient Medications Prior to Visit  Medication Sig Dispense Refill   acetaminophen (TYLENOL) 325 MG tablet Take 650 mg by mouth every 6 (six) hours as needed.     ibuprofen (ADVIL) 200 MG tablet Take 200 mg by mouth every 6 (six) hours as needed.     pantoprazole (PROTONIX) 40 MG tablet Take 1 tablet (40 mg total) by mouth daily. 30 tablet 3   HYDROcodone-acetaminophen (NORCO) 5-325 MG tablet Take 1 tablet by mouth 2 (two) times daily as needed. 20 tablet 0   traMADol (ULTRAM) 50 MG tablet Take 1-2 tablets (50-100 mg total) by mouth daily as needed. 20 tablet 0   No facility-administered medications prior to visit.   Allergies  Allergen Reactions   Prilosec [Omeprazole] Nausea Only   Objective:   Today's Vitals   02/02/21 1506  BP: (!) 142/84  Pulse: 82  Temp: (!) 97.5 F (36.4 C)  TempSrc: Temporal  SpO2: 97%  Weight: 181 lb 6.4 oz (82.3 kg)  Height: 5\' 2"  (1.575 m)   Body mass index is 33.18 kg/m.   General: Well developed, well nourished. No acute distress. HEENT: Normocephalic, non-traumatic. External ears normal. EAC and TMs normal bilaterally.   PERRL, EOMI. Conjunctiva clear. Fundiscopic exam shows normal disc and vasculature.Nose    clear without congestion or rhinorrhea. Mucous membranes moist. Oropharynx clear. Fair dentition. Neck: Supple. No lymphadenopathy. No thyromegaly. Lungs: Clear to auscultation bilaterally. No wheezing, rales or rhonchi. CV: RRR without murmurs or rubs.  Pulses 2+ bilaterally. Abdomen: Soft, non-tender. Bowel sounds positive, normal pitch and frequency. No   hepatosplenomegaly. No rebound or guarding. Extremities: Full ROM. No joint swelling or tenderness. No edema noted. Skin: Warm and dry. No rashes. The great toe nails have yellow discoloration into both nails.  Psych: Alert and oriented. Normal mood and affect.  Health Maintenance Due  Topic Date Due   HIV Screening  Never done   Hepatitis C  Screening  Never done   PAP SMEAR-Modifier  Never done   COLONOSCOPY (Pts 45-67yrs Insurance coverage will need to be confirmed)  Never done   MAMMOGRAM  Never done   Zoster Vaccines- Shingrix (1 of 2) Never done   Lab Results Lab Results  Component Value Date   HGBA1C 6.5 10/20/2020   BMP Latest Ref Rng & Units 10/20/2020 07/06/2016 02/15/2015  Glucose 70 - 99 mg/dL 99 128(H) 123(H)  BUN 6 - 23 mg/dL - 11 8  Creatinine 0.40 - 1.20 mg/dL - 0.49 0.56  Sodium 135 - 145 mEq/L - 135 138  Potassium 3.5 - 5.1 mEq/L - 4.1 3.9  Chloride 96 - 112 mEq/L - 102 104  CO2 19 - 32 mEq/L - 28 28  Calcium 8.4 - 10.5 mg/dL - 8.9 8.7(L)     Assessment & Plan:   1. Gastroesophageal reflux disease without esophagitis Sarah Rubio is improved on her Protonix. I will plan to continue this.  - pantoprazole (PROTONIX) 40 MG tablet; Take 1 tablet (40 mg total) by mouth daily.  Dispense: 30 tablet; Refill: 3  2. Hyperglycemia Sarah Rubio's A1c has been borderline for diagnosis of diabetes. She has made efforts to reduce her weight, improve her diet, and increase her physical activity. I will plan to repeat fasting labs at her next visit.  3. BMI 33.0-33.9,adult Weight is down 12 lbs. since April. I encouraged her to continue her current efforts.  4. Onychomycosis  - Ambulatory referral to Podiatry  5. Encounter for screening mammogram for malignant neoplasm of breast  - MM DIGITAL SCREENING BILATERAL; Future  6. Screening for cervical cancer Sarah Rubio's language barriers and the presence of her husband as interpretor make it difficult to explain about the need for her cervical cancer screening. She may have been about 6 years since her last pap smear. I will refer her to GYN to see if we can get this performed.  - Ambulatory referral to Gynecology  Haydee Salter, MD

## 2021-02-16 ENCOUNTER — Other Ambulatory Visit: Payer: Self-pay

## 2021-02-16 ENCOUNTER — Ambulatory Visit (INDEPENDENT_AMBULATORY_CARE_PROVIDER_SITE_OTHER): Payer: 59 | Admitting: Podiatry

## 2021-02-16 ENCOUNTER — Encounter: Payer: Self-pay | Admitting: Podiatry

## 2021-02-16 DIAGNOSIS — B351 Tinea unguium: Secondary | ICD-10-CM | POA: Diagnosis not present

## 2021-02-16 MED ORDER — CICLOPIROX 8 % EX SOLN: Freq: Every day | CUTANEOUS | 0 refills | Status: AC

## 2021-02-16 NOTE — Progress Notes (Signed)
  Subjective:  Patient ID: Sarah Rubio, female    DOB: 03-07-1971,   MRN: [id number]  No chief complaint on file.   50 y.o. female presents for concern of fungal nails. Here today with an interpreter. Relates both big toenails have been discolored for about 6 months. Relates 3 months ago she started using an OTC topical without relief. Was referred here by Dr. Gena Fray.  Denies any pain. Denies any other pedal complaints. Denies n/v/f/c.   Past Medical History:  Diagnosis Date   GERD (gastroesophageal reflux disease)    Mass of finger of right hand    5th finger    Objective:  Physical Exam: Vascular: DP/PT pulses 2/4 bilateral. CFT <3 seconds. Normal hair growth on digits. No edema.  Skin. No lacerations or abrasions bilateral feet. Discoloration noted to distal aspect of bilateral hallux nails.  Musculoskeletal: MMT 5/5 bilateral lower extremities in DF, PF, Inversion and Eversion. Deceased ROM in DF of ankle joint.  Neurological: Sensation intact to light touch.   Assessment:   1. Onychomycosis      Plan:  Patient was evaluated and treated and all questions answered. -Examined patient -Discussed treatment options for painful dystrophic nails  -Discussed fungal nail treatment options including oral, topical, and laser treatments.  -Patient would like to try prescription topical.  -Prescription for penlac provided.  -Patient to return in 3 months for re-evaluation.     Lorenda Peck, DPM

## 2021-03-09 ENCOUNTER — Encounter: Payer: 59 | Admitting: Obstetrics and Gynecology

## 2021-03-16 ENCOUNTER — Ambulatory Visit: Payer: 59

## 2021-04-20 ENCOUNTER — Ambulatory Visit
Admission: RE | Admit: 2021-04-20 | Discharge: 2021-04-20 | Disposition: A | Discharge status: Home / Self Care | Payer: 59 | Source: Ambulatory Visit | Attending: Family Medicine | Admitting: Family Medicine

## 2021-04-20 DIAGNOSIS — Z1231 Encounter for screening mammogram for malignant neoplasm of breast: Secondary | ICD-10-CM

## 2021-04-29 ENCOUNTER — Ambulatory Visit (INDEPENDENT_AMBULATORY_CARE_PROVIDER_SITE_OTHER): Payer: 59

## 2021-04-29 ENCOUNTER — Ambulatory Visit: Payer: 59 | Admitting: Family Medicine

## 2021-04-29 ENCOUNTER — Ambulatory Visit: Payer: 59

## 2021-04-29 ENCOUNTER — Other Ambulatory Visit: Payer: Self-pay

## 2021-04-29 ENCOUNTER — Telehealth: Payer: Self-pay | Admitting: Family Medicine

## 2021-04-29 VITALS — BP 150/90 | HR 85 | Temp 96.6°F | Ht 62.0 in | Wt 193.2 lb

## 2021-04-29 DIAGNOSIS — M25562 Pain in left knee: Secondary | ICD-10-CM | POA: Diagnosis not present

## 2021-04-29 DIAGNOSIS — R739 Hyperglycemia, unspecified: Secondary | ICD-10-CM

## 2021-04-29 DIAGNOSIS — G8929 Other chronic pain: Secondary | ICD-10-CM | POA: Diagnosis not present

## 2021-04-29 MED ORDER — NAPROXEN 500 MG PO TABS
500.0000 mg | ORAL_TABLET | Freq: Two times a day (BID) | ORAL | 3 refills | Status: AC
Start: 2021-04-29 — End: ?

## 2021-04-29 NOTE — Telephone Encounter (Signed)
Pt has dropped off paperwork for Dr. Gena Fray to fill out. I have placed the forms in his folder up front. Please advise [name] at [phone].

## 2021-04-29 NOTE — Patient Instructions (Signed)
Dolor de rodilla crnico en los adultos Chronic Knee Pain, Adult El dolor de rodilla que dura ms de 3 meses se denomina dolor de rodilla crnico. Puede sentir dolor en una o en ambas rodillas. Los sntomas de dolor de rodilla crnico tambin pueden incluir hinchazn y rigidez. La causa ms frecuente es el desgaste de la articulacin de la rodilla (osteoartritis) relacionado con la edad. Muchas afecciones pueden causar dolor crnico en las rodillas. El tratamiento depende de la causa. Los tratamientos principales son la fisioterapia y la prdida de [place]. Tambin puede tratarse con medicamentos, inyecciones, una rodillera o un dispositivo ortopdico, y el uso de [place]. Adems, puede recomendarse reposo, hielo, presin (compresin) y elevacin, lo que tambin se conoce como terapia RHCE. Siga estas instrucciones en su casa: Si tiene una rodillera o un dispositivo ortopdico:  Use la rodillera o el dispositivo ortopdico como se lo haya indicado el mdico. Quteselos solamente como se lo haya indicado el mdico. Afljeselos si los dedos del pie: Hormiguean. Se adormecen. Se tornan fros y de [employer]. Mantngalos limpios. Si la rodillera o el dispositivo ortopdico no son impermeables: No deje que se mojen. Pregntele al mdico si puede quitrselos cuando toma un bao de inmersin o [place]. Si no es as, use una cobertura impermeable. Control del dolor, la rigidez y la hinchazn   Si se lo indican, aplique calor sobre la rodilla. Hgalo con la frecuencia que le haya indicado el mdico. Use la fuente de calor que el mdico le recomiende, como una compresa de calor hmedo o una almohadilla trmica. Si tiene una rodillera o un dispositivo ortopdico que se puede quitar, proceda como se lo haya indicado el mdico. Coloque una toalla entre la piel y la fuente de Freight forwarder. Aplique calor durante 20 a 30 minutos. Retire la fuente de calor si la piel se le pone de color rojo brillante. Esto es [employer]. Si no puede sentir dolor, calor o fro, tiene un mayor riesgo de [place]. Si se lo indican, aplique hielo sobre la rodilla. Para hacer esto: Si tiene una rodillera o un dispositivo ortopdico que se puede quitar, proceda como se lo haya indicado el mdico. Ponga el hielo en una bolsa plstica. Coloque una toalla entre la piel y Therapist, nutritional. Aplique el hielo durante 20 minutos, 2 o 3 veces por da. Retire el hielo si la piel se le pone de color rojo brillante. Esto es [employer]. Si no puede sentir dolor, calor o fro, tiene un mayor riesgo de que se dae la zona. Mueva los dedos del pie con frecuencia. Cuando est sentado o acostado, mantenga la zona lesionada por encima del nivel del corazn. Actividad Evite las actividades en las que ambos pies no estn en contacto con el piso al mismo tiempo (actividades de alto impacto). Algunos ejemplos son correr, Art therapist soga y hacer saltos de tijera. Siga el plan de ejercicios que Warden/ranger. El mdico puede sugerirle que haga lo siguiente: Product/process development scientist las actividades que empeoren el dolor de rodilla. Es posible que deba cambiar los ejercicios que hace, los deportes que practica o sus obligaciones laborales. Usar calzado con suelas acolchonadas. Evitar deportes que requieran correr y Quarry manager de direccin repentinamente. Realizar ejercicios o fisioterapia. Esto se planifica en funcin de sus necesidades y capacidades. Haga ejercicios que mejoren el equilibrio y la fuerza, Avant tai chi y el yoga. No apoye el peso del cuerpo [employer] rodilla lesionada hasta que el mdico lo autorice. [place] Public  las [employer] se lo haya indicado el mdico. Regrese a sus actividades normales cuando el mdico le diga que es seguro. Indicaciones generales Use los medicamentos de venta libre y los recetados solamente como se lo haya indicado el mdico. Si tiene sobrepeso, trabaje con su mdico y un experto en alimentos (nutricionista) para establecer  metas para bajar de peso. El sobrepeso puede aumentar el dolor de rodilla. No fume ni consuma ningn producto que contenga nicotina o tabaco. Si necesita ayuda para dejar de consumir estos productos, consulte al mdico. Cumpla con todas las visitas de seguimiento. Comunquese con un mdico si: Tiene un dolor de rodilla que no mejora o que [place]. No puede hacer los ejercicios debido al dolor de rodilla. Solicite ayuda de inmediato si: La rodilla se hincha y la hinchazn empeora. No puede mover la rodilla. Siente mucho dolor en la rodilla. Resumen El dolor de rodilla que dura ms de 3 meses se denomina dolor de rodilla crnico. Los tratamientos principales para el dolor de rodilla crnico son la fisioterapia y la prdida de [place]. Tambin es posible que deba tomar medicamentos, usar una rodillera o un dispositivo ortopdico, usar muletas y aplicarse hielo o calor en la rodilla. Baje de peso si es necesario. Trabaje con su mdico y un experto en alimentacin (nutricionista) para que le ayuden a Conservation officer, nature para Sports coach de [place]. El sobrepeso puede aumentar el dolor de rodilla. Siga el plan de ejercicios que Warden/ranger. Esta informacin no tiene Marine scientist el consejo del mdico. Asegrese de hacerle al mdico cualquier pregunta que tenga. Document Revised: 10/16/2019 Document Reviewed: 10/16/2019 Elsevier Patient Education  [place].

## 2021-04-29 NOTE — Progress Notes (Signed)
[facility] [address] [place] [address] Dept: [phone] Dept Fax: [phone]  Office Visit  Subjective:    Patient ID: Sarah Rubio, female    DOB: 1970-12-27, 51 y.o..   MRN: [id number]  Chief Complaint  Patient presents with   Acute Visit    C/o having back /knee pain with long periods of standing at work. She needs something stating that she can have a chair to sit in instead of standing.     Medical Interpreter: Mickel Baas [phone])  History of Present Illness:  Patient is in today for evaluation of her left knee. She injured this knee by falling on it about 25 years ago. She notes that she fractured the knee, but did not end up needing surgery or having a cast. The knee eventually improved, however, when she stands or walks on this too much at work, she has increased knee pain. She was previously given a reasonable accomodation at work to allow her to sit. 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 She reports that around 1/12, er supervisor stopped allowing her to sit while she worked. Since then, her pain increased in the knee. As she was favoring the left side, she also started having more pain in her right knee and lower back. Her employer apparently had her take a week off work and told her she would need to bring in a "paper" from her doctor about her need to sit at work. As she did not accomplish this within a week, they have now told her not to return to work until she has such. She notes that she has some paperwork that was e-mailed to her which included a doctor's statement, but apparently she did not have a way to print this off.  Sarah Rubio had been evaluated this fall for potential progression of prediabetes to diabetes and is due for follow-up of this.  Past Medical History: Patient Active Problem List   Diagnosis Date Noted   Onychomycosis 02/02/2021   Mass of finger of right hand 10/22/2020    Gastroesophageal reflux disease without esophagitis 08/18/2020   Hyperglycemia 07/08/2016   BMI 33.0-33.9,adult 07/08/2016   Mixed hyperlipidemia 07/08/2016   Atypical mole 07/06/2016   Breast lump 03/07/2012   Past Surgical History:  Procedure Laterality Date   APPENDECTOMY     BREAST BIOPSY Left 2013   CYST EXCISION Right 10/22/2020   Procedure: EXCISION RIGHT FIFTH FINGER MASS;  Surgeon: Leandrew Koyanagi, MD;  Location: [facility];  Service: Orthopedics;  Laterality: Right;   TUBAL LIGATION     Family History  Problem Relation Age of Onset   Diabetes Mother    Breast cancer Neg Hx    Outpatient Medications Prior to Visit  Medication Sig Dispense Refill   acetaminophen (TYLENOL) 325 MG tablet Take 650 mg by mouth every 6 (six) hours as needed.     ciclopirox (PENLAC) 8 % solution Apply topically at bedtime. Apply over nail and surrounding skin. Apply daily over previous coat. After seven (7) days, may remove with alcohol and continue cycle. 6.6 mL 0   ibuprofen (ADVIL) 200 MG tablet Take 200 mg by mouth every 6 (six) hours as needed.     pantoprazole (PROTONIX) 40 MG tablet Take 1 tablet (40 mg total) by mouth daily. 30 tablet 3   No facility-administered medications prior to visit.   Allergies  Allergen Reactions   Prilosec [Omeprazole] Nausea Only  Objective:   Today's Vitals   04/29/21 0848  BP: (!) 150/90  Pulse: 85  Temp: (!) 96.6 F (35.9 C)  TempSrc: Temporal  SpO2: 97%  Weight: 193 lb 3.2 oz (87.6 kg)  Height: 5\' 2"  (1.575 m)   Body mass index is 35.34 kg/m.   General: Well developed, well nourished. No acute distress. Extremities: There is a discrete area of swelling lateral to the patellar tendon and below the   joint line of the left knee. This is tender on palpation. Her ROM is limited due to pain.  Varus/valgus and Lachman's testing appear normal. McMurray's testing was not adequate   due to her pain response. Psych: Alert and  oriented. Normal mood and affect.  Health Maintenance Due  Topic Date Due   HIV Screening  Never done   Hepatitis C Screening  Never done   PAP SMEAR-Modifier  Never done   COLONOSCOPY (Pts 45-58yrs Insurance coverage will need to be confirmed)  Never done   Zoster Vaccines- Shingrix (1 of 2) Never done   Imaging: Left knee x-ray- Normal appearance to knee  Lab Results Last hemoglobin A1c Lab Results  Component Value Date   HGBA1C 6.5 10/20/2020     Assessment & Plan:   1. Chronic pain of left knee Sarah Rubio has a chronic condition of her left knee related to an old injury that flares with prolonged standing and walking. On exam, she may have a peripatellar bursitis that is giving her trouble. However her pain response prevents an adequate exam. I will start her on an NSAID twice a day, recommend she use a knee sleeve, and refer her to see Dr. Erlinda Hong (who she has seen before) to evaluate the knee. In the meantime, I provided her a letter requesting her work reinstate her reasonable accomodation, allowign her to sit while at work. She will bring by the paperwork she received before for me to compelte any physician portion.  - DG Knee Complete 4 Views Left - Ambulatory referral to Orthopedic Surgery - naproxen (NAPROSYN) 500 MG tablet; Take 1 tablet (500 mg total) by mouth 2 (two) times daily with a meal.  Dispense: 30 tablet; Refill: 3  2. Hyperglycemia We will repeat her A1c today to confirm if she has diabetes.  - Glucose, random - Hemoglobin A1c   Haydee Salter, MD

## 2021-04-30 NOTE — Telephone Encounter (Signed)
Form placed on providers desk to be filled out.  Dm/cma

## 2021-04-30 NOTE — Telephone Encounter (Signed)
Form filled out and placed up front for pick up.   Patients daughter notified VIA phone. Dm/cma

## 2021-05-04 ENCOUNTER — Other Ambulatory Visit: Payer: 59

## 2021-05-05 ENCOUNTER — Other Ambulatory Visit: Payer: Self-pay

## 2021-05-05 ENCOUNTER — Encounter: Payer: Self-pay | Admitting: Physician Assistant

## 2021-05-05 ENCOUNTER — Ambulatory Visit (INDEPENDENT_AMBULATORY_CARE_PROVIDER_SITE_OTHER): Payer: 59 | Admitting: Orthopaedic Surgery

## 2021-05-05 DIAGNOSIS — G8929 Other chronic pain: Secondary | ICD-10-CM | POA: Diagnosis not present

## 2021-05-05 DIAGNOSIS — M25562 Pain in left knee: Secondary | ICD-10-CM | POA: Diagnosis not present

## 2021-05-05 MED ORDER — TRAMADOL HCL 50 MG PO TABS: 50.0000 mg | ORAL_TABLET | Freq: Two times a day (BID) | ORAL | 2 refills | Status: AC | PRN

## 2021-05-05 MED ORDER — DICLOFENAC SODIUM 75 MG PO TBEC: 75.0000 mg | DELAYED_RELEASE_TABLET | Freq: Two times a day (BID) | ORAL | 0 refills | Status: AC | PRN

## 2021-05-05 NOTE — Progress Notes (Signed)
Office Visit Note   Patient: Sarah Rubio           Date of Birth: Jul 04, 1970           MRN: [id number] Visit Date: 05/05/2021              Requested by: Haydee Salter, MD [place],  [place] [address] PCP: Haydee Salter, MD   Assessment & Plan: Visit Diagnoses:  1. Chronic pain of left knee     Plan: Impression is chronic left knee pain for 25 years.  Due to the longevity and severity of the patient's pain, I would like to go ahead and order an MRI to assess for structural abnormalities.  She will follow-up with [place] once completed.  Language barrier increased complexity of encounter.    Follow-Up Instructions: No follow-ups on file.   Orders:  Orders Placed This Encounter  Procedures   MR Knee Left w/o contrast   No orders of the defined types were placed in this encounter.     Procedures: No procedures performed   Clinical Data: No additional findings.   Subjective: Chief Complaint  Patient presents with   Left Knee - Pain    HPI patient is a pleasant 51 year old Spanish-speaking female who is here today with an interpreter.  She is here with left knee pain for the past 25 years.  At that time, she does note that she sustained a fall landing on the anterior knee.  Since this fall she is been unable to run, skip, jump or participate in any activities.  The pain is to the anterolateral aspect.  She also notes increased pain with any pressure to the area.  She even notes that she is unable to wear pantyhose due to this pain.  She has been taking Tylenol with out much relief.  No previous cortisone injection.  She has never been seen by a physician following this injury.  She denies any fevers chills or any other systemic symptoms.  Review of Systems as detailed in HPI.  All others reviewed and are negative.   Objective: Vital Signs: There were no vitals taken for this visit.  Physical Exam well-developed well-nourished female no acute  distress.  Alert and oriented x3.  Ortho Exam left knee exam shows painless range of motion from 0 to 110 degrees.  No skin changes.  Marked tenderness to the anterior knee primarily over the prepatellar bursa.  She does have lateral joint line tenderness.  It is hard to perform the rest of the exam due to guarding.  Specialty Comments:  No specialty comments available.  Imaging: X-rays reviewed by me in canopy show mild tricompartmental degenerative changes   PMFS History: Patient Active Problem List   Diagnosis Date Noted   Onychomycosis 02/02/2021   Mass of finger of right hand 10/22/2020   Gastroesophageal reflux disease without esophagitis 08/18/2020   Hyperglycemia 07/08/2016   BMI 33.0-33.9,adult 07/08/2016   Mixed hyperlipidemia 07/08/2016   Atypical mole 07/06/2016   Breast lump 03/07/2012   Past Medical History:  Diagnosis Date   GERD (gastroesophageal reflux disease)    Mass of finger of right hand    5th finger    Family History  Problem Relation Age of Onset   Diabetes Mother    Breast cancer Neg Hx     Past Surgical History:  Procedure Laterality Date   APPENDECTOMY     BREAST BIOPSY Left 2013   CYST EXCISION Right 10/22/2020  Procedure: EXCISION RIGHT FIFTH FINGER MASS;  Surgeon: Leandrew Koyanagi, MD;  Location: [facility];  Service: Orthopedics;  Laterality: Right;   TUBAL LIGATION     Social History   Occupational History   Not on file  Tobacco Use   Smoking status: Never   Smokeless tobacco: Never  Substance and Sexual Activity   Alcohol use: No   Drug use: No   Sexual activity: Yes    Birth control/protection: Surgical    Comment: BTL

## 2021-05-18 ENCOUNTER — Ambulatory Visit (HOSPITAL_COMMUNITY)
Admission: RE | Admit: 2021-05-18 | Discharge: 2021-05-18 | Disposition: A | Discharge status: Home / Self Care | Payer: 59 | Source: Ambulatory Visit | Attending: Physician Assistant | Admitting: Physician Assistant

## 2021-05-18 DIAGNOSIS — G8929 Other chronic pain: Secondary | ICD-10-CM

## 2021-05-19 ENCOUNTER — Ambulatory Visit: Payer: 59 | Admitting: Podiatry

## 2021-05-25 ENCOUNTER — Ambulatory Visit (INDEPENDENT_AMBULATORY_CARE_PROVIDER_SITE_OTHER): Payer: 59 | Admitting: Family Medicine

## 2021-05-25 ENCOUNTER — Other Ambulatory Visit: Payer: Self-pay

## 2021-05-25 VITALS — BP 122/80 | HR 76 | Temp 97.9°F | Ht 62.0 in | Wt 193.8 lb

## 2021-05-25 DIAGNOSIS — R739 Hyperglycemia, unspecified: Secondary | ICD-10-CM

## 2021-05-25 DIAGNOSIS — M25562 Pain in left knee: Secondary | ICD-10-CM | POA: Diagnosis not present

## 2021-05-25 DIAGNOSIS — G8929 Other chronic pain: Secondary | ICD-10-CM | POA: Insufficient documentation

## 2021-05-25 MED ORDER — MELOXICAM 15 MG PO TABS
15.0000 mg | ORAL_TABLET | Freq: Every day | ORAL | 5 refills | Status: AC
Start: 2021-05-25 — End: ?

## 2021-05-25 NOTE — Progress Notes (Signed)
[facility] [address] [place] [address] Dept: [phone] Dept Fax: [phone]  Office Visit  Subjective:    Patient ID: Sarah Rubio, female    DOB: 11/11/1970, 51 y.o..   MRN: [id number]  Chief Complaint  Patient presents with   Follow-up    F/u, needs a letter for work about chair.       History of Present Illness:  Patient is in today for reassessment of her chronic knee pain. Her husband, is providing medical interpretation. At her last visit, I evaluated her for a 25-year history of chronic left knee pain. She had an exacerbation of her pain, as her job had stopped allowing her to sit in a chair while she performed her work. I provided her with a letter and subsequently completed paperwork regarding a reasonable accomodation request for her to be allowed to return to sitting to perform her work. I also referred her to Dr. Erlinda Hong (orthopedics). He evaluated her and ordered an MRI, which has not been performed.  Now, Sarah Rubio notes that her employer has denied her reasonable accomodation. She is concerned that she will lose her job if she cannot be released to stand. She requests I provide a statement regarding this. She was started on some Mobic by a previous provider and has found that this helps.  Sarah Rubio has had prior blood sugars/A1cs suggestive that she may have mild diabetes. A repeat glucose and A1c was requested at her last visit, but she has not had this drawn.  Past Medical History: Patient Active Problem List   Diagnosis Date Noted   Chronic pain of left knee 05/25/2021   Onychomycosis 02/02/2021   Mass of finger of right hand 10/22/2020   Gastroesophageal reflux disease without esophagitis 08/18/2020   Hyperglycemia 07/08/2016   BMI 33.0-33.9,adult 07/08/2016   Mixed hyperlipidemia 07/08/2016   Atypical mole 07/06/2016   Breast lump 03/07/2012   Past Surgical History:  Procedure  Laterality Date   APPENDECTOMY     BREAST BIOPSY Left 2013   CYST EXCISION Right 10/22/2020   Procedure: EXCISION RIGHT FIFTH FINGER MASS;  Surgeon: Leandrew Koyanagi, MD;  Location: [facility];  Service: Orthopedics;  Laterality: Right;   TUBAL LIGATION     Family History  Problem Relation Age of Onset   Diabetes Mother    Breast cancer Neg Hx    Outpatient Medications Prior to Visit  Medication Sig Dispense Refill   acetaminophen (TYLENOL) 325 MG tablet Take 650 mg by mouth every 6 (six) hours as needed.     ciclopirox (PENLAC) 8 % solution Apply topically at bedtime. Apply over nail and surrounding skin. Apply daily over previous coat. After seven (7) days, may remove with alcohol and continue cycle. 6.6 mL 0   diclofenac (VOLTAREN) 75 MG EC tablet Take 1 tablet (75 mg total) by mouth 2 (two) times daily as needed. 60 tablet 0   ibuprofen (ADVIL) 200 MG tablet Take 200 mg by mouth every 6 (six) hours as needed.     naproxen (NAPROSYN) 500 MG tablet Take 1 tablet (500 mg total) by mouth 2 (two) times daily with a meal. 30 tablet 3   pantoprazole (PROTONIX) 40 MG tablet Take 1 tablet (40 mg total) by mouth daily. 30 tablet 3   traMADol (ULTRAM) 50 MG tablet Take 1 tablet (50 mg total) by mouth every 12 (twelve) hours as needed. 30 tablet 2   No facility-administered medications prior  to visit.   Allergies  Allergen Reactions   Prilosec [Omeprazole] Nausea Only   Objective:   Today's Vitals   05/25/21 0811  BP: 122/80  Pulse: 76  Temp: 97.9 F (36.6 C)  TempSrc: Temporal  SpO2: 98%  Weight: 193 lb 12.8 oz (87.9 kg)  Height: 5\' 2"  (1.575 m)   Body mass index is 35.45 kg/m.   General: Well developed, well nourished. No acute distress. Psych: Alert and oriented x3. Normal mood and affect.  Health Maintenance Due  Topic Date Due   HIV Screening  Never done   Hepatitis C Screening  Never done   PAP SMEAR-Modifier  Never done   COLONOSCOPY (Pts 45-60yrs  Insurance coverage will need to be confirmed)  Never done   Zoster Vaccines- Shingrix (1 of 2) Never done     Assessment & Plan:   1. Chronic pain of left knee At Sarah Rubio's request, I provided a letter to clear her to stand at her job. I anticipate that she may have increased knee pain due to this. I will renew her Mobic prescription. I recommend that she follow through with the MRI scan requested by Dr. Erlinda Hong. She will follow-up as needed.  - meloxicam (MOBIC) 15 MG tablet; Take 1 tablet (15 mg total) by mouth daily.  Dispense: 30 tablet; Refill: 5  2. Hyperglycemia Needs further testing, but patient declines a blood draw today. I remain concerned that she may meet criteria for Type 2 diabetes and needs appropriate treatment and monitoring. I will try again at her next visit to have her complete this testing.  Return in about 6 months (around 11/22/2021) for Reassessment.   Haydee Salter, MD

## 2021-07-29 ENCOUNTER — Ambulatory Visit: Payer: 59 | Admitting: Family Medicine

## 2022-02-05 ENCOUNTER — Encounter: Payer: Self-pay | Admitting: Family Medicine

## 2022-02-08 ENCOUNTER — Encounter: Payer: 59 | Admitting: Family Medicine

## 2022-12-06 IMAGING — DX DG KNEE COMPLETE 4+V*L*
4 series · 4 of 4 positions shown · non-contrast
Comparison: None.

CLINICAL DATA: Chronic left knee pain.

EXAM:
LEFT KNEE - COMPLETE 4+ VIEW

[knee ap]
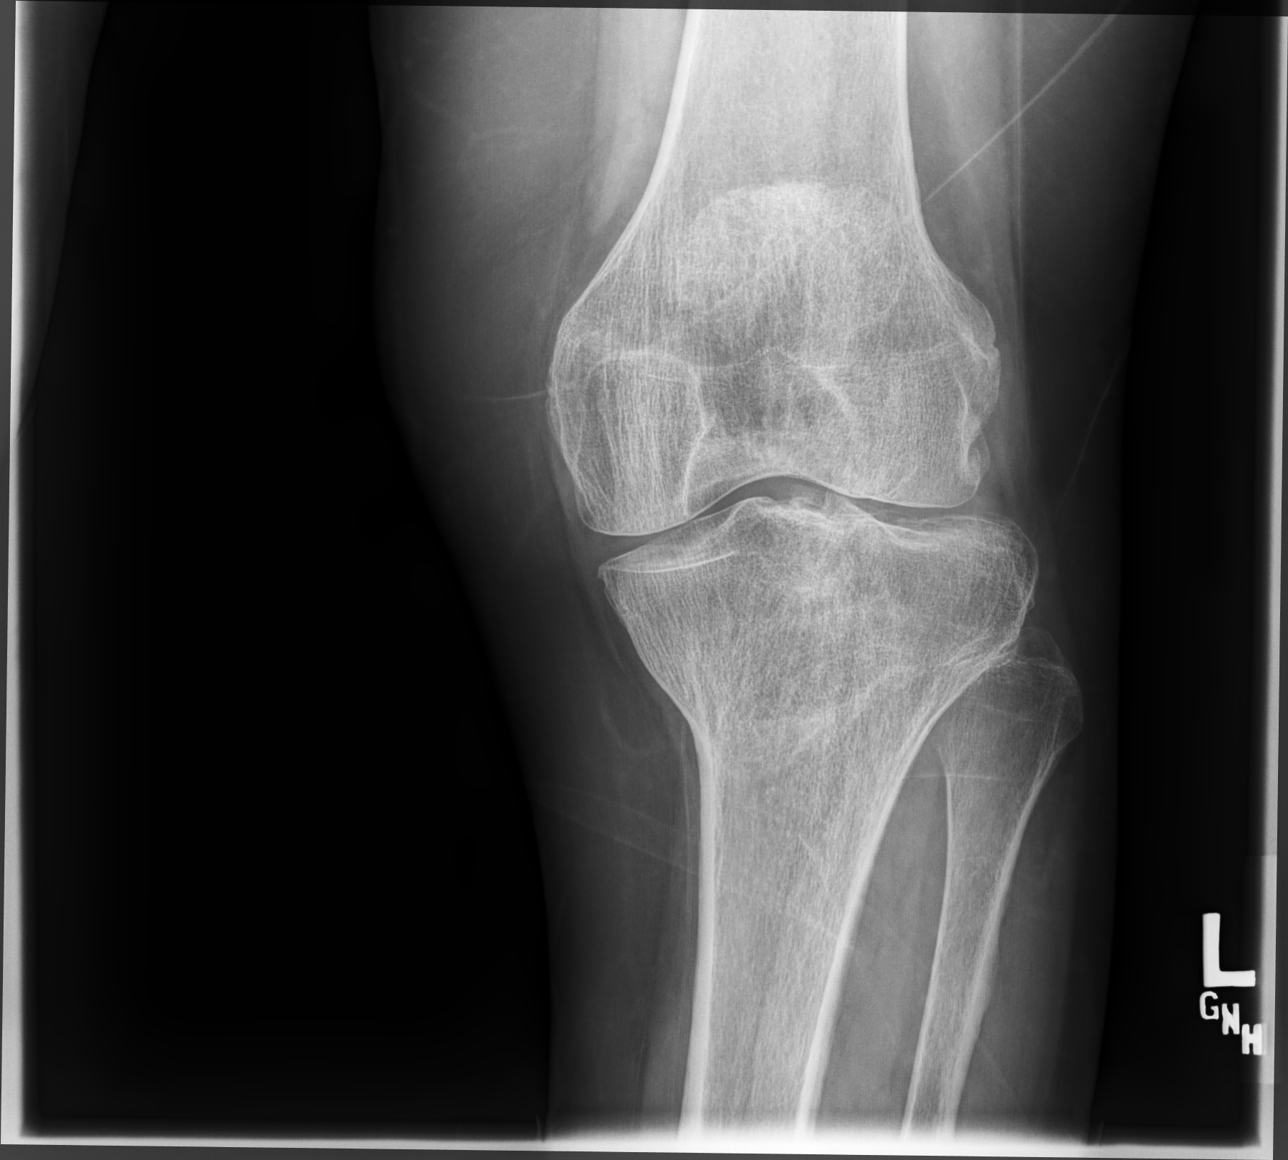

[knee [person_name] view pa]
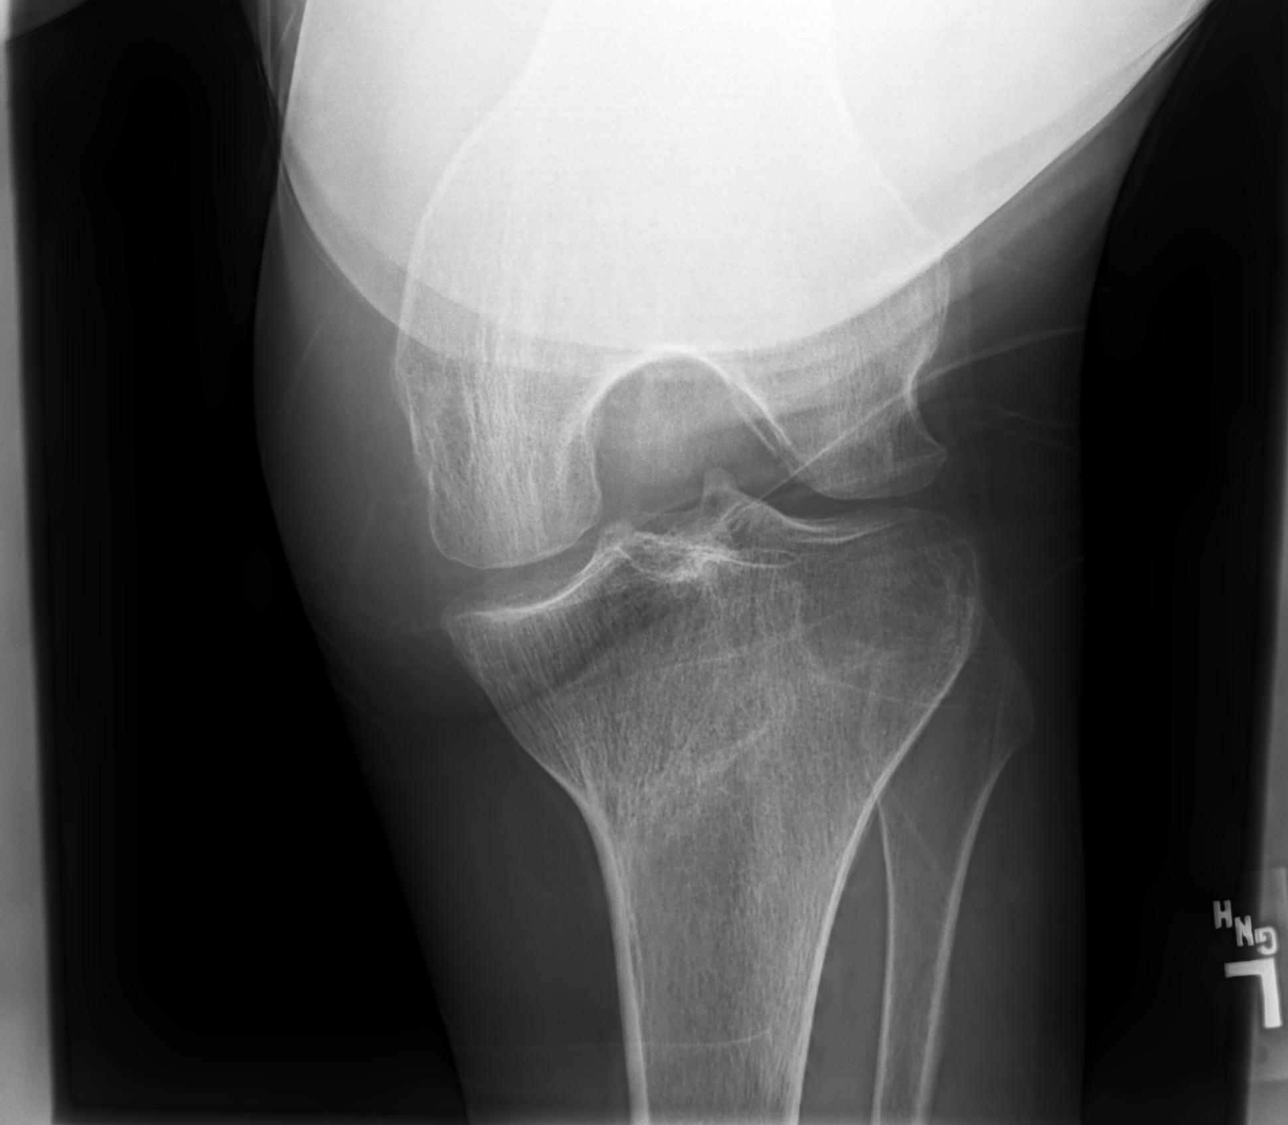

[knee lat]
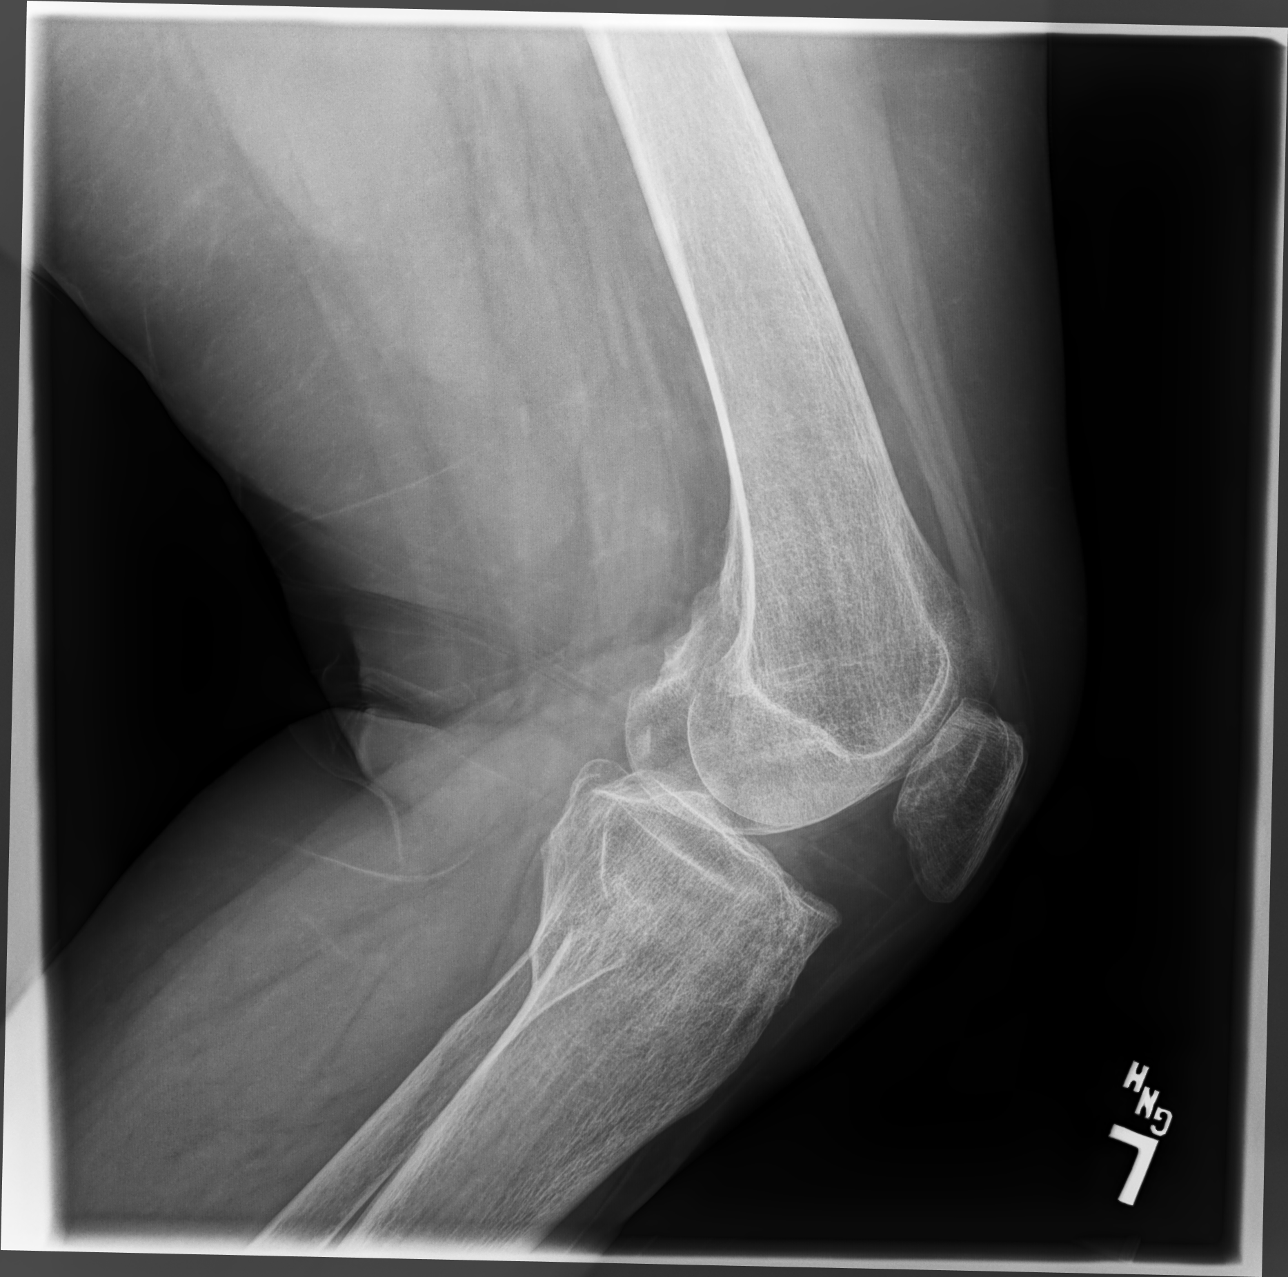

[patella (sunrise)]
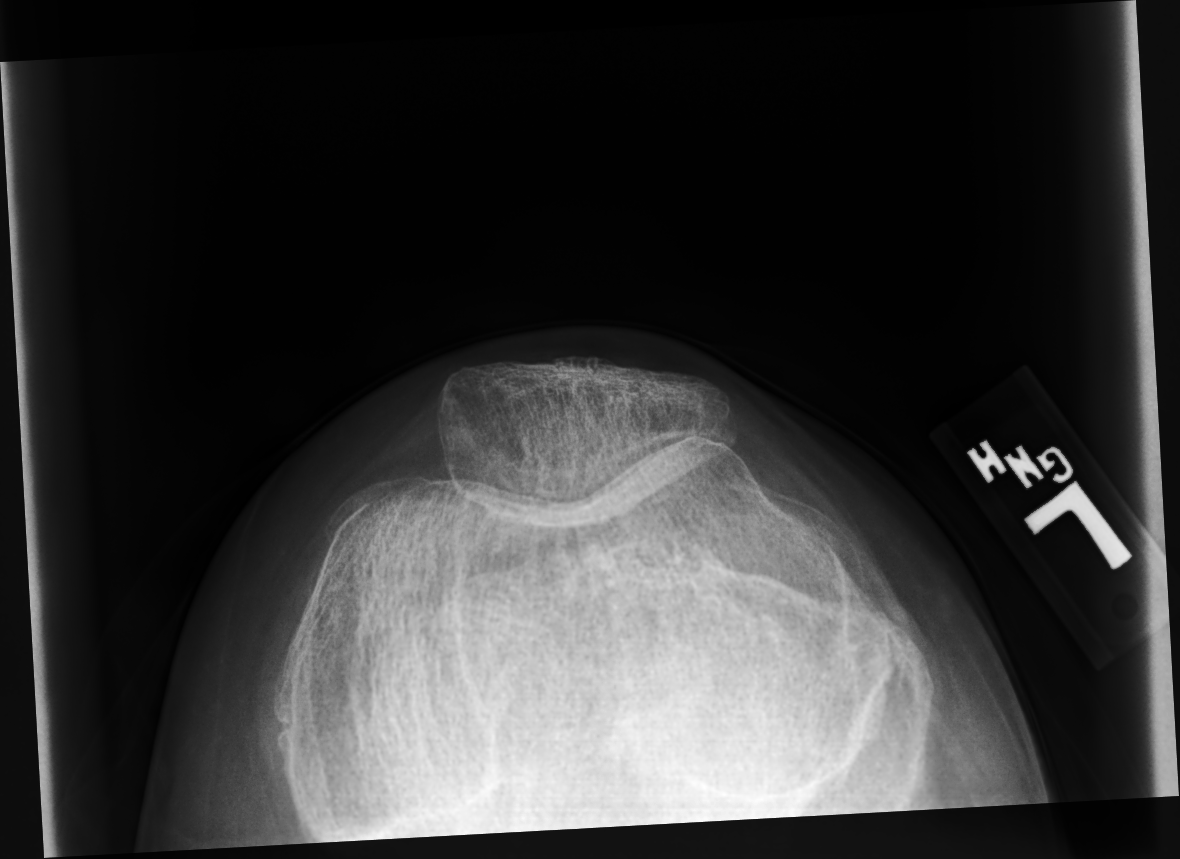

[4 of 4 positions shown; findings below may reference images not displayed]

FINDINGS: The bones are mildly osteopenic. Vertically oriented linear lucency
seen on a single view at the level of the medial tibial spine/tibial
plateau is favored is artifact. No definite acute fracture. No
dislocation. There is mild medial and patellofemoral compartment
joint space narrowing. There is no joint effusion or other soft
tissue abnormality.
IMPRESSION: 1. No acute fracture or dislocation identified.
2. Mild degenerative changes of the medial and patellofemoral
compartment.
